# Patient Record
Sex: Female | Born: 1995 | Race: Black or African American | Hispanic: No | Marital: Single | Smoking: Never smoker
Health system: Southern US, Community
[De-identification: ages and names within clinical notes are randomized; demographics above are authoritative.]

## PROBLEM LIST (undated history)

## (undated) DIAGNOSIS — J45909 Unspecified asthma, uncomplicated: Secondary | ICD-10-CM

---

## 2014-11-06 ENCOUNTER — Encounter (HOSPITAL_COMMUNITY): Payer: Self-pay | Admitting: Emergency Medicine

## 2014-11-06 ENCOUNTER — Emergency Department (HOSPITAL_COMMUNITY)
Admission: EM | Admit: 2014-11-06 | Discharge: 2014-11-06 | Disposition: A | Discharge status: Home / Self Care | Payer: No Typology Code available for payment source | Attending: Emergency Medicine | Admitting: Emergency Medicine

## 2014-11-06 DIAGNOSIS — J45901 Unspecified asthma with (acute) exacerbation: Secondary | ICD-10-CM | POA: Insufficient documentation

## 2014-11-06 DIAGNOSIS — R062 Wheezing: Secondary | ICD-10-CM | POA: Diagnosis present

## 2014-11-06 MED ORDER — PREDNISONE 20 MG PO TABS
60.0000 mg | ORAL_TABLET | Freq: Every day | ORAL | Status: DC
Start: 2014-11-07 — End: 2014-11-11

## 2014-11-06 MED ORDER — IPRATROPIUM-ALBUTEROL 0.5-2.5 (3) MG/3ML IN SOLN
RESPIRATORY_TRACT | Status: AC
  Filled 2014-11-06: qty 3

## 2014-11-06 MED ORDER — METHYLPREDNISOLONE SODIUM SUCC 125 MG IJ SOLR
125.0000 mg | Freq: Once | INTRAMUSCULAR | Status: AC
  Administered 2014-11-06: 125 mg via INTRAVENOUS
  Filled 2014-11-06: qty 2

## 2014-11-06 MED ORDER — IPRATROPIUM-ALBUTEROL 0.5-2.5 (3) MG/3ML IN SOLN
3.0000 mL | Freq: Once | RESPIRATORY_TRACT | Status: AC
  Administered 2014-11-06: 3 mL via RESPIRATORY_TRACT

## 2014-11-06 MED ORDER — IPRATROPIUM-ALBUTEROL 0.5-2.5 (3) MG/3ML IN SOLN
3.0000 mL | RESPIRATORY_TRACT | Status: DC | PRN
  Administered 2014-11-06: 3 mL via RESPIRATORY_TRACT
  Filled 2014-11-06: qty 3

## 2014-11-06 NOTE — ED Provider Notes (Signed)
CSN: [id number]     Arrival date & time 11/06/14  1904 History   First MD Initiated Contact with Patient 11/06/14 2002     Chief Complaint  Patient presents with  . Wheezing  . URI   (Consider location/radiation/quality/duration/timing/severity/associated sxs/prior Treatment) Patient is a 19 y.o. female presenting with wheezing and URI.  Wheezing Severity:  Moderate Severity compared to prior episodes:  Similar Onset quality:  Gradual Timing:  Constant Progression:  Worsening Chronicity:  Recurrent Context comment:  URI Relieved by:  Nothing Ineffective treatments:  Beta-agonist inhaler Associated symptoms: chest tightness, cough, rhinorrhea and shortness of breath   Associated symptoms: no chest pain, no ear pain, no fatigue, no fever, no sore throat, no sputum production and no stridor   URI Presenting symptoms: congestion, cough and rhinorrhea   Presenting symptoms: no ear pain, no fatigue, no fever and no sore throat   Associated symptoms: wheezing     History reviewed. No pertinent past medical history. History reviewed. No pertinent past surgical history. No family history on file. Social History  Substance Use Topics  . Smoking status: Never Smoker   . Smokeless tobacco: None  . Alcohol Use: No   OB History    No data available     Review of Systems  Constitutional: Negative for fever and fatigue.  HENT: Positive for congestion and rhinorrhea. Negative for ear pain and sore throat.   Respiratory: Positive for cough, chest tightness, shortness of breath and wheezing. Negative for sputum production and stridor.   Cardiovascular: Negative for chest pain.  Gastrointestinal: Negative for vomiting, abdominal pain and constipation.  Genitourinary: Negative for dysuria, flank pain and difficulty urinating.  Skin: Negative for pallor.  All other systems reviewed and are negative.  Allergies  Review of patient's allergies indicates no known allergies.  Home  Medications   Prior to Admission medications   Not on File   BP 122/72 mmHg  Pulse 108  Temp(Src) 97.6 F (36.4 C) (Oral)  Resp 16  Ht  (1.651 m)  Wt 161 lb 3 oz (73.114 kg)  BMI 26.82 kg/m2  SpO2 100%  LMP 10/13/2014 (Exact Date) Physical Exam  Constitutional: She is oriented to person, place, and time. She appears well-developed and well-nourished. No distress.  HENT:  Head: Normocephalic.  Eyes: Pupils are equal, round, and reactive to light. No scleral icterus.  Neck: Normal range of motion. No JVD present.  Cardiovascular: Normal rate.   No murmur heard. Pulmonary/Chest: She has wheezes. She has no rales. She exhibits no tenderness.  Bilateral inspiratory wheezing  Abdominal: Soft. She exhibits no distension. There is no tenderness. There is no rebound and no guarding.  Musculoskeletal: Normal range of motion.  Neurological: She is alert and oriented to person, place, and time. No cranial nerve deficit. Coordination normal.  Skin: Skin is warm and dry. She is not diaphoretic.  Psychiatric: Her behavior is normal.  Nursing note and vitals reviewed.   ED Course  Procedures (including critical care time)   MDM   Presenting with asthma exacerbation that started after having congestion, cough, runny nose since last night. No fevers. Has had history of hospitalization and intubation and came in once her albuterol inhalers were only minimally improving her wheezing. Takes pulmicort and albuterol as directed.  No distress.   Duonebs X 2 and solumedrol. Patient has improved. Feels safe for discharge.  Discharged home in good health with prednisone for 5 days. Patient given strict return precautions. Patient in agreement and  discharged home in good health.   Final diagnoses:  Asthma exacerbation      Deirdre Peer, MD 11/07/14 0001  Mirian Mo, MD 11/12/14 [phone]

## 2014-11-06 NOTE — Discharge Instructions (Signed)

## 2014-11-06 NOTE — ED Notes (Addendum)
Patient here with complaint of asthma exacerbation secondary to upper respiratory illness since yesterday. Denies fever. Endorses congestion and cough. Has been using albuterol nebulizer q4 hrs at home with minimal improvement.

## 2014-11-09 ENCOUNTER — Emergency Department (HOSPITAL_COMMUNITY): Payer: No Typology Code available for payment source

## 2014-11-09 ENCOUNTER — Inpatient Hospital Stay (HOSPITAL_COMMUNITY)
Admission: EM | Admit: 2014-11-09 | Discharge: 2014-11-11 | DRG: 203 | Disposition: A | Discharge status: Home / Self Care | Payer: No Typology Code available for payment source | Attending: Internal Medicine | Admitting: Internal Medicine

## 2014-11-09 ENCOUNTER — Encounter (HOSPITAL_COMMUNITY): Payer: Self-pay | Admitting: *Deleted

## 2014-11-09 DIAGNOSIS — R000 Tachycardia, unspecified: Secondary | ICD-10-CM | POA: Diagnosis present

## 2014-11-09 DIAGNOSIS — J45901 Unspecified asthma with (acute) exacerbation: Secondary | ICD-10-CM

## 2014-11-09 DIAGNOSIS — J4541 Moderate persistent asthma with (acute) exacerbation: Secondary | ICD-10-CM | POA: Diagnosis not present

## 2014-11-09 HISTORY — DX: Unspecified asthma, uncomplicated: J45.909

## 2014-11-09 LAB — I-STAT CHEM 8, ED
BUN: 11 mg/dL (ref 6–20)
CREATININE: 0.8 mg/dL (ref 0.44–1.00)
Calcium, Ion: 1.18 mmol/L (ref 1.12–1.23)
Chloride: 104 mmol/L (ref 101–111)
GLUCOSE: 86 mg/dL (ref 65–99)
HEMATOCRIT: 42 % (ref 36.0–46.0)
Hemoglobin: 14.3 g/dL (ref 12.0–15.0)
Potassium: 4.1 mmol/L (ref 3.5–5.1)
SODIUM: 142 mmol/L (ref 135–145)
TCO2: 26 mmol/L (ref 0–100)

## 2014-11-09 LAB — CBC WITH DIFFERENTIAL/PLATELET
BASOS PCT: 0 %
Basophils Absolute: 0 10*3/uL (ref 0.0–0.1)
EOS ABS: 0 10*3/uL (ref 0.0–0.7)
EOS PCT: 0 %
HCT: 39.1 % (ref 36.0–46.0)
Hemoglobin: 13.2 g/dL (ref 12.0–15.0)
LYMPHS ABS: 1 10*3/uL (ref 0.7–4.0)
Lymphocytes Relative: 11 %
MCH: 31.7 pg (ref 26.0–34.0)
MCHC: 33.8 g/dL (ref 30.0–36.0)
MCV: 93.8 fL (ref 78.0–100.0)
MONO ABS: 0.8 10*3/uL (ref 0.1–1.0)
MONOS PCT: 9 %
NEUTROS PCT: 80 %
Neutro Abs: 7.1 10*3/uL (ref 1.7–7.7)
PLATELETS: 225 10*3/uL (ref 150–400)
RBC: 4.17 MIL/uL (ref 3.87–5.11)
RDW: 12.6 % (ref 11.5–15.5)
WBC: 8.8 10*3/uL (ref 4.0–10.5)

## 2014-11-09 LAB — D-DIMER, QUANTITATIVE (NOT AT ARMC)

## 2014-11-09 MED ORDER — SODIUM CHLORIDE 0.9 % IV SOLN
Freq: Once | INTRAVENOUS | Status: AC
  Administered 2014-11-09: 20:00:00 via INTRAVENOUS

## 2014-11-09 MED ORDER — IPRATROPIUM-ALBUTEROL 0.5-2.5 (3) MG/3ML IN SOLN
RESPIRATORY_TRACT | Status: AC
  Administered 2014-11-09: 3 mL via RESPIRATORY_TRACT
  Filled 2014-11-09: qty 3

## 2014-11-09 MED ORDER — IPRATROPIUM-ALBUTEROL 0.5-2.5 (3) MG/3ML IN SOLN
3.0000 mL | Freq: Once | RESPIRATORY_TRACT | Status: AC
  Administered 2014-11-09: 3 mL via RESPIRATORY_TRACT

## 2014-11-09 MED ORDER — METHYLPREDNISOLONE SODIUM SUCC 125 MG IJ SOLR
60.0000 mg | Freq: Once | INTRAMUSCULAR | Status: AC
  Administered 2014-11-09: 60 mg via INTRAVENOUS
  Filled 2014-11-09: qty 2

## 2014-11-09 MED ORDER — ALBUTEROL (5 MG/ML) CONTINUOUS INHALATION SOLN
10.0000 mg/h | INHALATION_SOLUTION | Freq: Once | RESPIRATORY_TRACT | Status: AC
  Administered 2014-11-09: 10 mg/h via RESPIRATORY_TRACT
  Filled 2014-11-09: qty 20

## 2014-11-09 MED ORDER — MAGNESIUM SULFATE 2 GM/50ML IV SOLN
2.0000 g | Freq: Once | INTRAVENOUS | Status: AC
  Administered 2014-11-09: 2 g via INTRAVENOUS
  Filled 2014-11-09: qty 50

## 2014-11-09 NOTE — ED Notes (Signed)
Pt states that she was seen here a few days ago for same states that she was started on nebs and prednisone with no relief. Pt has inspiratory and expiratory wheezing in triage.

## 2014-11-09 NOTE — ED Provider Notes (Signed)
CSN: [id number]     Arrival date & time 11/09/14  1810 History   First MD Initiated Contact with Patient 11/09/14 1959     Chief Complaint  Patient presents with  . Asthma     (Consider location/radiation/quality/duration/timing/severity/associated sxs/prior Treatment) HPI Comments: This is a 19 year old female whose had asthma "all her life" who does not have frequent exacerbations.  She uses albuterol inhaler successfully at home.  Most of the time, 3 days ago she started having URI symptoms with an exacerbation of her asthma and wheezing.  She was seen in this emergency department, started on 60 mg prednisone daily for 5 days with instructions to increase the use of her halo 2 every 4-6 hours, which she has been doing.  She states she might have about an hour relief from wheezing.  She does state she was able to sleep.  Lastly for the first time in 3 days, but any activity causes the wheezing to return along with shortness of breath and coughing. Denies any risk factors for PE.  No recent travel, no recent surgery.  No family history or personal history of DVTs, no hormone therapy.    Patient is a 19 y.o. female presenting with asthma. The history is provided by the patient.  Asthma This is a recurrent problem. The current episode started in the past 7 days. The problem has been unchanged. Associated symptoms include coughing. Pertinent negatives include no chills, congestion, fever, nausea, rash, sore throat or swollen glands. Nothing aggravates the symptoms. She has tried nothing for the symptoms. The treatment provided no relief.    Past Medical History  Diagnosis Date  . Asthma    History reviewed. No pertinent past surgical history. No family history on file. Social History  Substance Use Topics  . Smoking status: Never Smoker   . Smokeless tobacco: None  . Alcohol Use: No   OB History    No data available     Review of Systems  Constitutional: Negative for fever and  chills.  HENT: Negative for congestion and sore throat.   Respiratory: Positive for cough, shortness of breath and wheezing.   Gastrointestinal: Negative for nausea.  Skin: Negative for rash.  Neurological: Negative for dizziness.  All other systems reviewed and are negative.     Allergies  Review of patient's allergies indicates no known allergies.  Home Medications   Prior to Admission medications   Medication Sig Start Date End Date Taking? Authorizing Provider  albuterol (PROVENTIL HFA;VENTOLIN HFA) 108 (90 BASE) MCG/ACT inhaler Inhale 1-2 puffs into the lungs every 6 (six) hours as needed for wheezing or shortness of breath.   Yes Historical Provider, MD  albuterol (PROVENTIL) (2.5 MG/3ML) 0.083% nebulizer solution Take 2.5 mg by nebulization every 4 (four) hours as needed for wheezing or shortness of breath.   Yes Historical Provider, MD  Budesonide (PULMICORT FLEXHALER) 90 MCG/ACT inhaler Inhale 2 puffs into the lungs 2 (two) times daily.   Yes Historical Provider, MD  predniSONE (DELTASONE) 20 MG tablet Take 3 tablets (60 mg total) by mouth daily. 11/07/14 11/11/14 Yes Deirdre Peer, MD   BP 106/51 mmHg  Pulse 87  Temp(Src) 98.3 F (36.8 C) (Oral)  Resp 21  Ht  (1.651 m)  Wt 156 lb (70.761 kg)  BMI 25.96 kg/m2  SpO2 96%  LMP 10/13/2014 (Exact Date) Physical Exam  Constitutional: She appears well-developed and well-nourished.  HENT:  Head: Normocephalic.  Eyes: Pupils are equal, round, and reactive to light.  Neck: Normal range of motion.  Cardiovascular: Normal rate and regular rhythm.   Pulmonary/Chest: Effort normal. No respiratory distress. She has wheezes. She exhibits no tenderness.  Abdominal: Soft. She exhibits no distension.  Musculoskeletal: Normal range of motion.  Neurological: She is alert.  Skin: Skin is warm. No rash noted. No pallor.  Nursing note and vitals reviewed.   ED Course  Procedures (including critical care time) Labs Review Labs  Reviewed  CBC WITH DIFFERENTIAL/PLATELET  D-DIMER, QUANTITATIVE (NOT AT [facility])  I-STAT CHEM 8, ED    Imaging Review Dg Chest 2 View  11/09/2014   CLINICAL DATA:  Shortness of breath.  Wheezing.  History of asthma.  EXAM: CHEST  2 VIEW  COMPARISON:  None.  FINDINGS: Normal heart size. Normal mediastinal contour. No pneumothorax. No pleural effusion. Clear lungs, with no focal lung consolidation and no pulmonary edema. Lungs do not appear significantly hyperinflated. Visualized osseous structures appear intact.  IMPRESSION: No active disease in the chest.   Electronically Signed   By: Delbert Phenix M.D.   On: 11/09/2014 21:16   I have personally reviewed and evaluated these images and lab results as part of my medical decision-making.   EKG Interpretation None     after I will augment treatment.  Patient is still wheezing.  She will be given 60 mg of Solu-Medrol IV 2 g of magnesium IV and admitted to the hospital for regular Solu-Medrol and neb treatments until her exacerbation resolves Patient reexamined after Solu-Medrol and magnesium IV.  She still having expiratory  wheezing remains tachycardic at 127  MDM   Final diagnoses:  Asthma exacerbation         Earley Favor, NP 11/10/14 0107  Richardean Canal, MD 11/10/14 [phone]

## 2014-11-10 DIAGNOSIS — R000 Tachycardia, unspecified: Secondary | ICD-10-CM | POA: Diagnosis present

## 2014-11-10 DIAGNOSIS — J45901 Unspecified asthma with (acute) exacerbation: Secondary | ICD-10-CM | POA: Diagnosis present

## 2014-11-10 DIAGNOSIS — J4541 Moderate persistent asthma with (acute) exacerbation: Secondary | ICD-10-CM | POA: Diagnosis present

## 2014-11-10 LAB — PREGNANCY, URINE: Preg Test, Ur: NEGATIVE

## 2014-11-10 MED ORDER — ALBUTEROL SULFATE (2.5 MG/3ML) 0.083% IN NEBU
2.5000 mg | INHALATION_SOLUTION | RESPIRATORY_TRACT | Status: DC
  Administered 2014-11-10 – 2014-11-11 (×9): 2.5 mg via RESPIRATORY_TRACT
  Filled 2014-11-10 (×8): qty 3

## 2014-11-10 MED ORDER — ACETAMINOPHEN 325 MG PO TABS: 650.0000 mg | ORAL_TABLET | Freq: Four times a day (QID) | ORAL | Status: DC | PRN

## 2014-11-10 MED ORDER — METHYLPREDNISOLONE SODIUM SUCC 125 MG IJ SOLR: 60.0000 mg | Freq: Two times a day (BID) | INTRAMUSCULAR | Status: DC

## 2014-11-10 MED ORDER — ONDANSETRON HCL 4 MG PO TABS: 4.0000 mg | ORAL_TABLET | Freq: Four times a day (QID) | ORAL | Status: DC | PRN

## 2014-11-10 MED ORDER — ENOXAPARIN SODIUM 40 MG/0.4ML ~~LOC~~ SOLN: 40.0000 mg | SUBCUTANEOUS | Status: DC

## 2014-11-10 MED ORDER — GUAIFENESIN ER 600 MG PO TB12: 600.0000 mg | ORAL_TABLET | Freq: Two times a day (BID) | ORAL | Status: DC

## 2014-11-10 MED ORDER — ONDANSETRON HCL 4 MG/2ML IJ SOLN: 4.0000 mg | Freq: Four times a day (QID) | INTRAMUSCULAR | Status: DC | PRN

## 2014-11-10 MED ORDER — METHYLPREDNISOLONE SODIUM SUCC 125 MG IJ SOLR
60.0000 mg | Freq: Four times a day (QID) | INTRAMUSCULAR | Status: DC
  Administered 2014-11-10 – 2014-11-11 (×5): 60 mg via INTRAVENOUS
  Filled 2014-11-10 (×5): qty 2

## 2014-11-10 MED ORDER — ACETAMINOPHEN 650 MG RE SUPP: 650.0000 mg | Freq: Four times a day (QID) | RECTAL | Status: DC | PRN

## 2014-11-10 MED ORDER — GUAIFENESIN ER 600 MG PO TB12
1200.0000 mg | ORAL_TABLET | Freq: Two times a day (BID) | ORAL | Status: DC
  Administered 2014-11-10 (×2): 1200 mg via ORAL
  Filled 2014-11-10 (×2): qty 2

## 2014-11-10 MED ORDER — IPRATROPIUM BROMIDE 0.02 % IN SOLN: 0.5000 mg | RESPIRATORY_TRACT | Status: DC | PRN

## 2014-11-10 NOTE — Care Management Note (Signed)
Case Management Note  Patient Details  Name: Raven Holland MRN: [id number] Date of Birth: 1995-04-23  Subjective/Objective:                  Date:11-10-14 Thursday Spoke with patient at the bedside. Introduced self as Sports coach and explained role in discharge planning and how to be reached. Verified patient lives in [place], [address], [address] [place] [address], in apartment with roommate. Patient originally  From [facility] with parents, Webb Silversmith- mom [name]- dad [address] [place]. [phone]. Verified patient anticipates to go Apt. W/ roommates at time of discharge. Patient has DME nebulizer. Expressed potential need for no other DME. Patient denied needing help with their medication. Patient states she is on her parent's insurance [employer] member #[id number] Group# [id number]. Patient drives to MD appointments. Verified patient has PCP Dr Alycia Rossetti in [place], [place]. Patient states they currently receive HH services through no one.  Patient was provided choice and selected name for home health needs if they arise.    Lawerance Sabal RN BSN CM [phone]   Action/Plan:  Will continue to follow.   Expected Discharge Date:                  Expected Discharge Plan:  Home/Self Care  In-House Referral:     Discharge planning Services  CM Consult  Post Acute Care Choice:    Choice offered to:     DME Arranged:    DME Agency:     HH Arranged:    HH Agency:     Status of Service:  In process, will continue to follow  Medicare Important Message Given:    Date Medicare IM Given:    Medicare IM give by:    Date Additional Medicare IM Given:    Additional Medicare Important Message give by:     If discussed at Long Length of Stay Meetings, dates discussed:    Additional Comments:  Lawerance Sabal, RN 11/10/2014, 11:17 AM

## 2014-11-10 NOTE — H&P (Signed)
[facility] History and Physical  Patient: Raven Holland  MRN: [id number]  DOB: 1995-12-17  DOS: the patient was seen and examined on 11/10/2014 PCP: No PCP Per Patient  Referring physician: Dr. Silverio Lay Chief Complaint: Cough and shortness of breath  HPI: Raven Holland is a 19 y.o. female with Past medical history of asthma. The patient presents with numbness of progressively worsening cough and shortness of breath. The patient was initially seen in the ER for this any worsening of her shortness of breath on 11/06/2014. The patient was discharged on oral prednisone as well as when necessary nebulizers. The patient was able to do nebulizers at least 3-4 times a day. Despite this the patient continues to have progressively worsening cough and shortness of breath and therefore decided to come to the hospital she complains of some chest tightness. Denies having any fever or chills no nausea and vomiting. No diarrhea no constipation no burning urination or leg swelling. No recent travel no recent exposure.  The patient is coming from home.  At her baseline ambulates without support And is independent for most of her ADL; manages her medication on her own.  Review of Systems: as mentioned in the history of present illness.  A comprehensive review of the other systems is negative.  Past Medical History  Diagnosis Date  . Asthma    History reviewed. No pertinent past surgical history. Social History:  reports that she has never smoked. She does not have any smokeless tobacco history on file. She reports that she does not drink alcohol or use illicit drugs.  No Known Allergies  No family history on file.  Prior to Admission medications   Medication Sig Start Date End Date Taking? Authorizing Provider  albuterol (PROVENTIL HFA;VENTOLIN HFA) 108 (90 BASE) MCG/ACT inhaler Inhale 1-2 puffs into the lungs every 6 (six) hours as needed for wheezing or shortness of breath.   Yes Historical  Provider, MD  albuterol (PROVENTIL) (2.5 MG/3ML) 0.083% nebulizer solution Take 2.5 mg by nebulization every 4 (four) hours as needed for wheezing or shortness of breath.   Yes Historical Provider, MD  Budesonide (PULMICORT FLEXHALER) 90 MCG/ACT inhaler Inhale 2 puffs into the lungs 2 (two) times daily.   Yes Historical Provider, MD  predniSONE (DELTASONE) 20 MG tablet Take 3 tablets (60 mg total) by mouth daily. 11/07/14 11/11/14 Yes Deirdre Peer, MD    Physical Exam: Filed Vitals:   11/10/14 0030 11/10/14 0143 11/10/14 0213 11/10/14 0322  BP: 106/51 117/71    Pulse: 87 87    Temp:  98 F (36.7 C)    TempSrc:  Oral    Resp:  18    Height:      Weight:      SpO2: 96% 97% 98% 98%    General: Alert, Awake and Oriented to Time, Place and Person. Appear in mild distress Eyes: PERRL ENT: Oral Mucosa clear moist. Neck: no JVD Cardiovascular: S1 and S2 Present, no Murmur, Peripheral Pulses Present Respiratory: Bilateral Air entry equal and Decreased,  no Crackles, extensive expiratory wheezes Abdomen: Bowel Sound present, Soft and no tenderness Skin: no Rash Extremities: no Pedal edema, no calf tenderness Neurologic: Grossly no focal neuro deficit.  Labs on Admission:  CBC:  Recent Labs Lab 11/09/14 2020 11/09/14 2026  WBC 8.8  --   NEUTROABS 7.1  --   HGB 13.2 14.3  HCT 39.1 42.0  MCV 93.8  --   PLT 225  --     CMP  Component Value Date/Time   NA 142 11/09/2014 2026   K 4.1 11/09/2014 2026   CL 104 11/09/2014 2026   GLUCOSE 86 11/09/2014 2026   BUN 11 11/09/2014 2026   CREATININE 0.80 11/09/2014 2026    No results for input(s): CKTOTAL, CKMB, CKMBINDEX, TROPONINI in the last 168 hours. BNP (last 3 results) No results for input(s): BNP in the last 8760 hours.  ProBNP (last 3 results) No results for input(s): PROBNP in the last 8760 hours.   Radiological Exams on Admission: Dg Chest 2 View  11/09/2014   CLINICAL DATA:  Shortness of breath.  Wheezing.   History of asthma.  EXAM: CHEST  2 VIEW  COMPARISON:  None.  FINDINGS: Normal heart size. Normal mediastinal contour. No pneumothorax. No pleural effusion. Clear lungs, with no focal lung consolidation and no pulmonary edema. Lungs do not appear significantly hyperinflated. Visualized osseous structures appear intact.  IMPRESSION: No active disease in the chest.   Electronically Signed   By: Delbert Phenix M.D.   On: 11/09/2014 21:16   Assessment/Plan 1. Asthma exacerbation The patient is presenting with numbness of progressively worsening cough and shortness of breath. Next and the patient has received albuterol ipratropium as well as Solu-Medrol in the ER as well as magnesium. Despite this treatment the patient has not had any improvement in her breathing as well as improvement in the wheezing. Patient remains tachycardic as well as tachypneic. With this the patient will be admitted in the hospital for observation. Next I would continue albuterol every 4 hours as well as a continue Solu-Medrol and Mucinex. She will be also given when necessary ipratropium.  Nutrition: Regular diet DVT Prophylaxis: subcutaneous Heparin  Advance goals of care discussion:  Full code  Disposition: Admitted as observation, med-surge unit.  Author: Lynden Oxford, MD [facility] Pager: [phone] 11/10/2014  If 7PM-7AM, please contact night-coverage [email or web address] Password [email or web address]

## 2014-11-10 NOTE — Progress Notes (Signed)
[facility] PROGRESS NOTE  Raven Holland ZOX:[id number] DOB: [birth year] DOA: 11/09/2014 PCP: No PCP Per Patient  HPI: Raven Holland is a 19 y.o female who was admitted to the hospital on 11/09/14 following an ED visit for persistent cough, SOB and wheeze. She was seen for the same complaints in the ED on 11/06/14 and was discharged with oral prednisone and at-home inhalers. Symptoms did not improve despite compliance with medication regimen. No infectious etiology identified in ED. PMH includes asthma triggered by fall weather changes, which required previous hospitalization in October on 2015 and intubation.   Subjective: Today the patient reports that she is doing much better. She was able to sleep through the night and has noticed significant decrease in wheezing. Cough still present, but now nonproductive. She said she is not back to baseline yet, but very close. Denies fever, chills, chest pain, palpitations, or SOB. No N/V/D or abdominal pain.   Assessment/Plan: Principle problem: Acute Asthma Exacerbation:  - Evident by progressively worsening cough, SOB, and wheezing with associated tachycardia/tachypnea despite oral Prednisone, Pulmicort, and Albuterol nebulizer 4x/day for 2 days.  - No infectious etiology suspected- patient is afebrile without leukocytosis, CXR reveled no acute disease.  - Tachycardia and tachypnea resolved, mild b/l wheeze remains.  - Continue mucinex for cough/congestion and nebulized duoneb q4h PRN, Solu-medrol q6h, and albuterol q4h PRN today. Monitor for resolution of wheeze and/or worsening of respiratory symptoms.    Active Problem:  Moderate persistent asthma, uncontrolled:  - History of asthma since the age of 75 months. Managed by pediatrician, no pulmonologist - Reports use of PRN albuterol nebulizer at least 3x/week in addition to daily use of inhaled albuterol PRN and pulmicort BID - Will  control acute asthma exacerbation during hospitalization,  recommend outpatient FU with a pulmonologist for asthma control  Code Status: Full Family Communication: Cousin at bedside Disposition Plan: Home when stable  DVT Prophylaxis: Lovenox    Consultants:  None  Procedures:  None  Antibiotics:  None    Objective: Filed Vitals:   11/10/14 0703  BP: 103/52  Pulse: 76  Temp:   Resp: 13    Intake/Output Summary (Last 24 hours) at 11/10/14 1011 Last data filed at 11/10/14 8119  Gross per 24 hour  Intake      0 ml  Output    600 ml  Net   -600 ml   Filed Weights   11/09/14 1814  Weight: 70.761 kg (156 lb)    Exam:   General:  Well-appearing young woman, sitting up in bed with no acute distress  Cardiovascular: RRR, no m/r/g. No peripheral edema   Respiratory: Distant breath sounds bilaterally with mild expiratory wheeze. No crackles   Abdomen: Soft, non-tender, non-distended. + BS   Neuro: A&Ox3, No focal neurological deficits  Data Reviewed: Basic Metabolic Panel:  Recent Labs Lab 11/09/14 2026  NA 142  K 4.1  CL 104  GLUCOSE 86  BUN 11  CREATININE 0.80   Liver Function Tests: No results for input(s): AST, ALT, ALKPHOS, BILITOT, PROT, ALBUMIN in the last 168 hours. No results for input(s): LIPASE, AMYLASE in the last 168 hours. No results for input(s): AMMONIA in the last 168 hours. CBC:  Recent Labs Lab 11/09/14 2020 11/09/14 2026  WBC 8.8  --   NEUTROABS 7.1  --   HGB 13.2 14.3  HCT 39.1 42.0  MCV 93.8  --   PLT 225  --    Cardiac Enzymes: No results for input(s): CKTOTAL, CKMB,  CKMBINDEX, TROPONINI in the last 168 hours. BNP (last 3 results) No results for input(s): BNP in the last 8760 hours.  ProBNP (last 3 results) No results for input(s): PROBNP in the last 8760 hours.  CBG: No results for input(s): GLUCAP in the last 168 hours.  No results found for this or any previous visit (from the past 240 hour(s)).   Studies: Dg Chest 2 View  11/09/2014   CLINICAL DATA:   Shortness of breath.  Wheezing.  History of asthma.  EXAM: CHEST  2 VIEW  COMPARISON:  None.  FINDINGS: Normal heart size. Normal mediastinal contour. No pneumothorax. No pleural effusion. Clear lungs, with no focal lung consolidation and no pulmonary edema. Lungs do not appear significantly hyperinflated. Visualized osseous structures appear intact.  IMPRESSION: No active disease in the chest.   Electronically Signed   By: Delbert Phenix M.D.   On: 11/09/2014 21:16    Scheduled Meds: . albuterol  2.5 mg Nebulization Q4H  . enoxaparin (LOVENOX) injection  40 mg Subcutaneous Q24H  . guaiFENesin  1,200 mg Oral BID  . methylPREDNISolone (SOLU-MEDROL) injection  60 mg Intravenous Q6H   Continuous Infusions:   Principal Problem:   Asthma exacerbation    Time spent: 20 minutes    Micayla Zeltman, Student-PA  [facility] If 7PM-7AM, please contact night-coverage at [email or web address], password [email or web address] 11/10/2014, 10:11 AM      Addendum  Patient seen and examined, chart and data base reviewed.  I agree with the above assessment and plan.  For full details please see Mrs. Micayla Zeltman, Student-PA note.  I reviewed and amended the above note as appropriate.   Clint Lipps, MD [facility] Pager: [phone] 11/10/2014, 12:21 PM

## 2014-11-11 DIAGNOSIS — R000 Tachycardia, unspecified: Secondary | ICD-10-CM

## 2014-11-11 MED ORDER — PREDNISONE 10 MG PO TABS: ORAL_TABLET | ORAL | Status: DC

## 2014-11-11 MED ORDER — GUAIFENESIN ER 600 MG PO TB12: 1200.0000 mg | ORAL_TABLET | Freq: Two times a day (BID) | ORAL | Status: DC

## 2014-11-11 NOTE — Progress Notes (Signed)
Patient discharge teaching given, including activity, diet, follow-up appoints, and medications. Patient verbalized understanding of all discharge instructions. IV access was d/c'd. Vitals are stable. Skin is intact except as charted in most recent assessments. Pt to be escorted out by NT, to be driven home by family. 

## 2014-11-11 NOTE — Discharge Summary (Signed)
Physician Discharge Summary  Shakeera Rightmyer ZOX:[id number] DOB: [birth year] DOA: 11/09/2014  PCP: No PCP Per Patient  Admit date: 11/09/2014 Discharge date: 11/11/2014  Time spent: 15 minutes  Recommendations for Outpatient Follow-up:  1. FU w/ PCP regarding re-fills of chronic albuterol medications, Recommend finding adult-care PCP  2. Contact a Pulmonologist for appropriate management of uncontrolled asthma   Discharge Diagnoses:  Principal Problem:   Asthma exacerbation Active Problems:   Tachycardia  Principle problem: Acute Asthma Exacerbation:  - Evident by progressively worsening cough, SOB, and wheezing with associated tachycardia/tachypnea despite oral Prednisone, Pulmicort, and Albuterol nebulizer 4x/day for 2 days.  - No infectious etiology suspected- patient afebrile without leukocytosis, CXR reveled no acute disease.  - Tachycardia and tachypnea resolved, mild b/l wheeze remains d/t chronic asthma  - Discharged with mucinex for congestion and oral prednisone taper. Continue Pulmicort BID/albuterol PRN w/ Nebulizer PRN    Active Problem:  Moderate persistent asthma, uncontrolled:  - History of asthma since the age of 34 months. Managed by pediatrician, no pulmonologist - Reports use of PRN albuterol nebulizer at least 3x/week in addition to daily use of inhaled albuterol PRN and pulmicort BID - Acute asthma exacerbation controlled, recommend outpatient FU with Pulmonologist for asthma control  Discharge Condition: Stable for discharge home   Diet recommendation: None   Filed Weights   11/09/14 1814 11/11/14 0547  Weight: 70.761 kg (156 lb) 71.079 kg (156 lb 11.2 oz)    History of present illness:  Raven Holland is a 19 y.o female who was admitted to the hospital on 11/09/14 following an ED visit for persistent cough, SOB and wheeze. She was seen for the same complaints in the ED on 11/06/14 and was discharged with oral prednisone and at-home inhalers. Symptoms did not  improve despite compliance with medication regimen and patient was now tachycardia with tachypnea. In the ED, no infectious etiology identified and patient was started on Duoneb treatments, Magnesium, and Solu-medrol. PMH includes asthma triggered by fall weather changes, which required previous hospitalization in October on 2015 and intubation.   Hospital Course:  The patient was admitted to the hospital on 11/10/2014 for an acute asthma exacerbation. During hospitalization, patient was continued on Duoneb treatments q4h, Solu-medrol q6h, and albuterol q4h PRN. In addition, the patient was given Mucinex for complaints of upper respiratory congestion. With this medication regimen, the cough, and SOB resolved. Residual wheezing present, but much improved. Patient discharged on at-home inhalers and Prednisone taper.   Procedures:  None  Consultations:  None  Discharge Exam: Filed Vitals:   11/11/14 0546  BP: 115/68  Pulse: 92  Temp: 98.2 F (36.8 C)  Resp: 16    General: Well-appearing young woman, sitting up in bed with no acute distress  Cardiovascular: RRR, no m/r/g. No peripheral edema   Respiratory: Distant breath sounds bilaterally with inspiratory/expiratory wheeze. No crackles   Abdomen: Soft, non-tender, non-distended. + BS   Neuro: A&Ox3, No focal neurological deficits  Discharge Instructions   Discharge Instructions    Increase activity slowly    Complete by:  As directed           Current Discharge Medication List    START taking these medications   Details  guaiFENesin (MUCINEX) 600 MG 12 hr tablet Take 2 tablets (1,200 mg total) by mouth 2 (two) times daily. Qty: 30 tablet, Refills: 30      CONTINUE these medications which have CHANGED   Details  predniSONE (DELTASONE) 10 MG tablet Take 6-5-4-3-2-1 tablet  PO daily till it is gone Qty: 21 tablet, Refills: 0      CONTINUE these medications which have NOT CHANGED   Details  albuterol (PROVENTIL  HFA;VENTOLIN HFA) 108 (90 BASE) MCG/ACT inhaler Inhale 1-2 puffs into the lungs every 6 (six) hours as needed for wheezing or shortness of breath.    albuterol (PROVENTIL) (2.5 MG/3ML) 0.083% nebulizer solution Take 2.5 mg by nebulization every 4 (four) hours as needed for wheezing or shortness of breath.    Budesonide (PULMICORT FLEXHALER) 90 MCG/ACT inhaler Inhale 2 puffs into the lungs 2 (two) times daily.       No Known Allergies Follow-up Information    Follow up with RYAN, JEFFREY C, MD In 1 week.   Specialty:  Pediatrics   Contact information:   [address] [place] [address] [phone]        The results of significant diagnostics from this hospitalization (including imaging, microbiology, ancillary and laboratory) are listed below for reference.    Significant Diagnostic Studies: Dg Chest 2 View  11/09/2014   CLINICAL DATA:  Shortness of breath.  Wheezing.  History of asthma.  EXAM: CHEST  2 VIEW  COMPARISON:  None.  FINDINGS: Normal heart size. Normal mediastinal contour. No pneumothorax. No pleural effusion. Clear lungs, with no focal lung consolidation and no pulmonary edema. Lungs do not appear significantly hyperinflated. Visualized osseous structures appear intact.  IMPRESSION: No active disease in the chest.   Electronically Signed   By: Delbert Phenix M.D.   On: 11/09/2014 21:16    Microbiology: No results found for this or any previous visit (from the past 240 hour(s)).   Labs: Basic Metabolic Panel:  Recent Labs Lab 11/09/14 2026  NA 142  K 4.1  CL 104  GLUCOSE 86  BUN 11  CREATININE 0.80   Liver Function Tests: No results for input(s): AST, ALT, ALKPHOS, BILITOT, PROT, ALBUMIN in the last 168 hours. No results for input(s): LIPASE, AMYLASE in the last 168 hours. No results for input(s): AMMONIA in the last 168 hours. CBC:  Recent Labs Lab 11/09/14 2020 11/09/14 2026  WBC 8.8  --   NEUTROABS 7.1  --   HGB 13.2 14.3  HCT 39.1  42.0  MCV 93.8  --   PLT 225  --    Cardiac Enzymes: No results for input(s): CKTOTAL, CKMB, CKMBINDEX, TROPONINI in the last 168 hours. BNP: BNP (last 3 results) No results for input(s): BNP in the last 8760 hours.  ProBNP (last 3 results) No results for input(s): PROBNP in the last 8760 hours.  CBG: No results for input(s): GLUCAP in the last 168 hours.   Signed:  Connye Burkitt, Student-PA  [facility] 11/11/2014, 10:42 AM    Addendum  Patient seen and examined, chart and data base reviewed.  I agree with the above assessment and plan.  For full details please see Mrs. Micayla Zeltman, Student-PA note.  I reviewed and amended the above note as appropriate.   Clint Lipps, MD [facility] Pager: [phone] 11/11/2014, 12:14 PM

## 2015-03-30 ENCOUNTER — Emergency Department (INDEPENDENT_AMBULATORY_CARE_PROVIDER_SITE_OTHER)
Admission: EM | Admit: 2015-03-30 | Discharge: 2015-03-30 | Disposition: A | Discharge status: Home / Self Care | Payer: No Typology Code available for payment source | Source: Home / Self Care | Attending: Family Medicine | Admitting: Family Medicine

## 2015-03-30 ENCOUNTER — Encounter (HOSPITAL_COMMUNITY): Payer: Self-pay

## 2015-03-30 DIAGNOSIS — J45901 Unspecified asthma with (acute) exacerbation: Secondary | ICD-10-CM

## 2015-03-30 MED ORDER — PREDNISONE 10 MG PO TABS: ORAL_TABLET | ORAL | Status: DC

## 2015-03-30 MED ORDER — ALBUTEROL SULFATE (2.5 MG/3ML) 0.083% IN NEBU: 2.5000 mg | INHALATION_SOLUTION | Freq: Once | RESPIRATORY_TRACT | Status: DC

## 2015-03-30 MED ORDER — IPRATROPIUM BROMIDE 0.02 % IN SOLN
RESPIRATORY_TRACT | Status: AC
  Filled 2015-03-30: qty 2.5

## 2015-03-30 NOTE — ED Notes (Signed)
Pt stated that her inhalers have not been helping her asthma symptoms for the last 2 days. Pt stated that she is not currently in any pain. Pt alert and oriented

## 2015-03-30 NOTE — Discharge Instructions (Signed)
Asthma, Adult °Asthma is a condition of the lungs in which the airways tighten and narrow. Asthma can make it hard to breathe. Asthma cannot be cured, but medicine and lifestyle changes can help control it. Asthma may be started (triggered) by: °· Animal skin flakes (dander). °· Dust. °· Cockroaches. °· Pollen. °· Mold. °· Smoke. °· Cleaning products. °· Hair sprays or aerosol sprays. °· Paint fumes or strong smells. °· Cold air, weather changes, and winds. °· Crying or laughing hard. °· Stress. °· Certain medicines or drugs. °· Foods, such as dried fruit, potato chips, and sparkling grape juice. °· Infections or conditions (colds, flu). °· Exercise. °· Certain medical conditions or diseases. °· Exercise or tiring activities. °HOME CARE  °· Take medicine as told by your doctor. °· Use a peak flow meter as told by your doctor. A peak flow meter is a tool that measures how well the lungs are working. °· Record and keep track of the peak flow meter's readings. °· Understand and use the asthma action plan. An asthma action plan is a written plan for taking care of your asthma and treating your attacks. °· To help prevent asthma attacks: °· Do not smoke. Stay away from secondhand smoke. °· Change your heating and air conditioning filter often. °· Limit your use of fireplaces and wood stoves. °· Get rid of pests (such as roaches and mice) and their droppings. °· Throw away plants if you see mold on them. °· Clean your floors. Dust regularly. Use cleaning products that do not smell. °· Have someone vacuum when you are not home. Use a vacuum cleaner with a HEPA filter if possible. °· Replace carpet with wood, tile, or vinyl flooring. Carpet can trap animal skin flakes and dust. °· Use allergy-proof pillows, mattress covers, and box spring covers. °· Wash bed sheets and blankets every week in hot water and dry them in a dryer. °· Use blankets that are made of polyester or cotton. °· Clean bathrooms and kitchens with bleach.  If possible, have someone repaint the walls in these rooms with mold-resistant paint. Keep out of the rooms that are being cleaned and painted. °· Wash hands often. °GET HELP IF: °· You have make a whistling sound when breaking (wheeze), have shortness of breath, or have a cough even if taking medicine to prevent attacks. °· The colored mucus you cough up (sputum) is thicker than usual. °· The colored mucus you cough up changes from clear or white to yellow, green, gray, or bloody. °· You have problems from the medicine you are taking such as: °· A rash. °· Itching. °· Swelling. °· Trouble breathing. °· You need reliever medicines more than 2-3 times a week. °· Your peak flow measurement is still at 50-79% of your personal best after following the action plan for 1 hour. °· You have a fever. °GET HELP RIGHT AWAY IF:  °· You seem to be worse and are not responding to medicine during an asthma attack. °· You are short of breath even at rest. °· You get short of breath when doing very little activity. °· You have trouble eating, drinking, or talking. °· You have chest pain. °· You have a fast heartbeat. °· Your lips or fingernails start to turn blue. °· You are light-headed, dizzy, or faint. °· Your peak flow is less than 50% of your personal best. °  °This information is not intended to replace advice given to you by your health care provider. Make sure   you discuss any questions you have with your health care provider.   Document Released: 07/17/2007 Document Revised: 10/19/2014 Document Reviewed: 08/27/2012 Elsevier Interactive Patient Education 2016 [employer]  Asthma, Acute Bronchospasm Acute bronchospasm caused by asthma is also referred to as an asthma attack. Bronchospasm means your air passages become narrowed. The narrowing is caused by inflammation and tightening of the muscles in the air tubes (bronchi) in your lungs. This can make it hard to breathe or cause you to wheeze and  cough. CAUSES Possible triggers are:  Animal dander from the skin, hair, or feathers of animals.  Dust mites contained in house dust.  Cockroaches.  Pollen from trees or grass.  Mold.  Cigarette or tobacco smoke.  Air pollutants such as dust, household cleaners, hair sprays, aerosol sprays, paint fumes, strong chemicals, or strong odors.  Cold air or weather changes. Cold air may trigger inflammation. Winds increase molds and pollens in the air.  Strong emotions such as crying or laughing hard.  Stress.  Certain medicines such as aspirin or beta-blockers.  Sulfites in foods and drinks, such as dried fruits and wine.  Infections or inflammatory conditions, such as a flu, cold, or inflammation of the nasal membranes (rhinitis).  Gastroesophageal reflux disease (GERD). GERD is a condition where stomach acid backs up into your esophagus.  Exercise or strenuous activity. SIGNS AND SYMPTOMS   Wheezing.  Excessive coughing, particularly at night.  Chest tightness.  Shortness of breath. DIAGNOSIS  Your health care provider will ask you about your medical history and perform a physical exam. A chest X-ray or blood testing may be performed to look for other causes of your symptoms or other conditions that may have triggered your asthma attack. TREATMENT  Treatment is aimed at reducing inflammation and opening up the airways in your lungs. Most asthma attacks are treated with inhaled medicines. These include quick relief or rescue medicines (such as bronchodilators) and controller medicines (such as inhaled corticosteroids). These medicines are sometimes given through an inhaler or a nebulizer. Systemic steroid medicine taken by mouth or given through an IV tube also can be used to reduce the inflammation when an attack is moderate or severe. Antibiotic medicines are only used if a bacterial infection is present.  HOME CARE INSTRUCTIONS   Rest.  Drink plenty of liquids. This  helps the mucus to remain thin and be easily coughed up. Only use caffeine in moderation and do not use alcohol until you have recovered from your illness.  Do not smoke. Avoid being exposed to secondhand smoke.  You play a critical role in keeping yourself in good health. Avoid exposure to things that cause you to wheeze or to have breathing problems.  Keep your medicines up-to-date and available. Carefully follow your health care provider's treatment plan.  Take your medicine exactly as prescribed.  When pollen or pollution is bad, keep windows closed and use an air conditioner or go to places with air conditioning.  Asthma requires careful medical care. See your health care provider for a follow-up as advised. If you are more than [redacted] weeks pregnant and you were prescribed any new medicines, let your obstetrician know about the visit and how you are doing. Follow up with your health care provider as directed.  After you have recovered from your asthma attack, make an appointment with your outpatient doctor to talk about ways to reduce the likelihood of future attacks. If you do not have a doctor who manages your asthma, make an  appointment with a primary care doctor to discuss your asthma. SEEK IMMEDIATE MEDICAL CARE IF:   You are getting worse.  You have trouble breathing. If severe, call your local emergency services (911 in the [place]  You develop chest pain or discomfort.  You are vomiting.  You are not able to keep fluids down.  You are coughing up yellow, green, brown, or bloody sputum.  You have a fever and your symptoms suddenly get worse.  You have trouble swallowing. MAKE SURE YOU:   Understand these instructions.  Will watch your condition.  Will get help right away if you are not doing well or get worse.   This information is not intended to replace advice given to you by your health care provider. Make sure you discuss any questions you have with your health  care provider.   Document Released: 05/15/2006 Document Revised: 02/02/2013 Document Reviewed: 08/05/2012 Elsevier Interactive Patient Education [employer]

## 2015-03-31 NOTE — ED Provider Notes (Signed)
CSN: [id number]     Arrival date & time 03/30/15  1811 History   First MD Initiated Contact with Patient 03/30/15 1921     Chief Complaint  Patient presents with  . Asthma   (Consider location/radiation/quality/duration/timing/severity/associated sxs/prior Treatment) HPI Wheezing for the last few days. Use of inhaler and neb machine not really helping. Has increased use of treatments. Thinks it is because of the change in the weather No pain, fever, d&v Past Medical History  Diagnosis Date  . Asthma    History reviewed. No pertinent past surgical history. History reviewed. No pertinent family history. Social History  Substance Use Topics  . Smoking status: Never Smoker   . Smokeless tobacco: None  . Alcohol Use: No   OB History    No data available     Review of Systems See hpi Allergies  Review of patient's allergies indicates no known allergies.  Home Medications   Prior to Admission medications   Medication Sig Start Date End Date Taking? Authorizing Provider  albuterol (PROVENTIL HFA;VENTOLIN HFA) 108 (90 BASE) MCG/ACT inhaler Inhale 1-2 puffs into the lungs every 6 (six) hours as needed for wheezing or shortness of breath.   Yes Historical Provider, MD  albuterol (PROVENTIL) (2.5 MG/3ML) 0.083% nebulizer solution Take 2.5 mg by nebulization every 4 (four) hours as needed for wheezing or shortness of breath.   Yes Historical Provider, MD  Budesonide (PULMICORT FLEXHALER) 90 MCG/ACT inhaler Inhale 2 puffs into the lungs 2 (two) times daily.   Yes Historical Provider, MD  guaiFENesin (MUCINEX) 600 MG 12 hr tablet Take 2 tablets (1,200 mg total) by mouth 2 (two) times daily. 11/11/14  Yes Clydia Llano, MD  predniSONE (DELTASONE) 10 MG tablet Sig: 4 tables once a day for 3 days, 3 tablets once a day X3 days, 2 tablets a day for 3 days, 1 tablet a day for 3 days. 03/30/15   Tharon Aquas, PA   Meds Ordered and Administered this Visit  Medications - No data to display  BP  128/76 mmHg  Pulse 85  Temp(Src) 98 F (36.7 C) (Oral)  SpO2 97%  LMP 03/18/2015 No data found.   Physical Exam  Constitutional: She is oriented to person, place, and time. She appears well-developed and well-nourished. No distress.  Eyes: Conjunctivae are normal.  Pulmonary/Chest: Effort normal. She has wheezes.  Abdominal: Soft.  Musculoskeletal: Normal range of motion.  Neurological: She is alert and oriented to person, place, and time.  Skin: Skin is warm and dry.  Psychiatric: She has a normal mood and affect. Her behavior is normal.  Nursing note and vitals reviewed.   ED Course  Procedures (including critical care time)  Labs Review Labs Reviewed - No data to display  Imaging Review No results found.   Visual Acuity Review  Right Eye Distance:   Left Eye Distance:   Bilateral Distance:    Right Eye Near:   Left Eye Near:    Bilateral Near:       Pt states she feels better after treatment:  MDM   1. Asthma exacerbation    Patient is advised to continue home symptomatic treatment. Prescription forPrednisone  sent pharmacy patient has indicated. Patient is advised that if there are new or worsening symptoms or attend the emergency department, or contact primary care provider. Instructions of care provided discharged home in stable condition. Return to work/school note provided.  THIS NOTE WAS GENERATED USING A VOICE RECOGNITION SOFTWARE PROGRAM. ALL REASONABLE EFFORTS  WERE MADE TO PROOFREAD THIS DOCUMENT FOR ACCURACY.    Tharon Aquas, [place] 03/31/15 [phone]

## 2015-04-21 ENCOUNTER — Encounter (HOSPITAL_COMMUNITY): Payer: Self-pay | Admitting: Emergency Medicine

## 2015-04-21 ENCOUNTER — Emergency Department (HOSPITAL_COMMUNITY)
Admission: EM | Admit: 2015-04-21 | Discharge: 2015-04-21 | Disposition: A | Discharge status: Home / Self Care | Payer: No Typology Code available for payment source | Attending: Emergency Medicine | Admitting: Emergency Medicine

## 2015-04-21 DIAGNOSIS — J45901 Unspecified asthma with (acute) exacerbation: Secondary | ICD-10-CM | POA: Diagnosis not present

## 2015-04-21 DIAGNOSIS — Z7952 Long term (current) use of systemic steroids: Secondary | ICD-10-CM | POA: Insufficient documentation

## 2015-04-21 DIAGNOSIS — Z79899 Other long term (current) drug therapy: Secondary | ICD-10-CM | POA: Insufficient documentation

## 2015-04-21 DIAGNOSIS — Z7951 Long term (current) use of inhaled steroids: Secondary | ICD-10-CM | POA: Diagnosis not present

## 2015-04-21 DIAGNOSIS — R0602 Shortness of breath: Secondary | ICD-10-CM | POA: Diagnosis present

## 2015-04-21 MED ORDER — ALBUTEROL SULFATE (2.5 MG/3ML) 0.083% IN NEBU
INHALATION_SOLUTION | RESPIRATORY_TRACT | Status: AC
  Filled 2015-04-21: qty 6

## 2015-04-21 MED ORDER — IPRATROPIUM BROMIDE 0.02 % IN SOLN
0.5000 mg | Freq: Once | RESPIRATORY_TRACT | Status: AC
  Administered 2015-04-21: 0.5 mg via RESPIRATORY_TRACT
  Filled 2015-04-21: qty 2.5

## 2015-04-21 MED ORDER — ALBUTEROL (5 MG/ML) CONTINUOUS INHALATION SOLN
10.0000 mg/h | INHALATION_SOLUTION | Freq: Once | RESPIRATORY_TRACT | Status: AC
  Administered 2015-04-21: 10 mg/h via RESPIRATORY_TRACT
  Filled 2015-04-21: qty 20

## 2015-04-21 MED ORDER — DEXAMETHASONE SODIUM PHOSPHATE 10 MG/ML IJ SOLN
10.0000 mg | Freq: Once | INTRAMUSCULAR | Status: DC
  Filled 2015-04-21: qty 1

## 2015-04-21 MED ORDER — ALBUTEROL SULFATE HFA 108 (90 BASE) MCG/ACT IN AERS
2.0000 | INHALATION_SPRAY | Freq: Once | RESPIRATORY_TRACT | Status: AC
  Administered 2015-04-21: 2 via RESPIRATORY_TRACT
  Filled 2015-04-21: qty 6.7

## 2015-04-21 MED ORDER — IPRATROPIUM-ALBUTEROL 0.5-2.5 (3) MG/3ML IN SOLN
3.0000 mL | Freq: Once | RESPIRATORY_TRACT | Status: AC
  Administered 2015-04-21: 3 mL via RESPIRATORY_TRACT
  Filled 2015-04-21: qty 3

## 2015-04-21 MED ORDER — PREDNISONE 20 MG PO TABS
60.0000 mg | ORAL_TABLET | Freq: Once | ORAL | Status: AC
  Administered 2015-04-21: 60 mg via ORAL
  Filled 2015-04-21: qty 3

## 2015-04-21 MED ORDER — PREDNISONE 10 MG (21) PO TBPK: 10.0000 mg | ORAL_TABLET | Freq: Every day | ORAL | Status: DC

## 2015-04-21 MED ORDER — ALBUTEROL SULFATE (2.5 MG/3ML) 0.083% IN NEBU
5.0000 mg | INHALATION_SOLUTION | Freq: Once | RESPIRATORY_TRACT | Status: AC
  Administered 2015-04-21: 5 mg via RESPIRATORY_TRACT

## 2015-04-21 MED ORDER — ALBUTEROL SULFATE HFA 108 (90 BASE) MCG/ACT IN AERS: 1.0000 | INHALATION_SPRAY | Freq: Four times a day (QID) | RESPIRATORY_TRACT | Status: DC | PRN

## 2015-04-21 NOTE — Discharge Instructions (Signed)
Read the information below.  Use the prescribed medication as directed.  Please discuss all new medications with your pharmacist.  You may return to the Emergency Department at any time for worsening condition or any new symptoms that concern you.   If you develop worsening shortness of breath, uncontrolled wheezing, severe chest pain, or fevers despite using tylenol and/or ibuprofen, return for a recheck.     ° ° °Asthma, Adult °Asthma is a recurring condition in which the airways tighten and narrow. Asthma can make it difficult to breathe. It can cause coughing, wheezing, and shortness of breath. Asthma episodes, also called asthma attacks, range from minor to life-threatening. Asthma cannot be cured, but medicines and lifestyle changes can help control it. °CAUSES °Asthma is believed to be caused by inherited (genetic) and environmental factors, but its exact cause is unknown. Asthma may be triggered by allergens, lung infections, or irritants in the air. Asthma triggers are different for each person. Common triggers include:  °· Animal dander. °· Dust mites. °· Cockroaches. °· Pollen from trees or grass. °· Mold. °· Smoke. °· Air pollutants such as dust, household cleaners, hair sprays, aerosol sprays, paint fumes, strong chemicals, or strong odors. °· Cold air, weather changes, and winds (which increase molds and pollens in the air). °· Strong emotional expressions such as crying or laughing hard. °· Stress. °· Certain medicines (such as aspirin) or types of drugs (such as beta-blockers). °· Sulfites in foods and drinks. Foods and drinks that may contain sulfites include dried fruit, potato chips, and sparkling grape juice. °· Infections or inflammatory conditions such as the flu, a cold, or an inflammation of the nasal membranes (rhinitis). °· Gastroesophageal reflux disease (GERD). °· Exercise or strenuous activity. °SYMPTOMS °Symptoms may occur immediately after asthma is triggered or many hours later.  Symptoms include: °· Wheezing. °· Excessive nighttime or early morning coughing. °· Frequent or severe coughing with a common cold. °· Chest tightness. °· Shortness of breath. °DIAGNOSIS  °The diagnosis of asthma is made by a review of your medical history and a physical exam. Tests may also be performed. These may include: °· Lung function studies. These tests show how much air you breathe in and out. °· Allergy tests. °· Imaging tests such as X-rays. °TREATMENT  °Asthma cannot be cured, but it can usually be controlled. Treatment involves identifying and avoiding your asthma triggers. It also involves medicines. There are 2 classes of medicine used for asthma treatment:  °· Controller medicines. These prevent asthma symptoms from occurring. They are usually taken every day. °· Reliever or rescue medicines. These quickly relieve asthma symptoms. They are used as needed and provide short-term relief. °Your health care provider will help you create an asthma action plan. An asthma action plan is a written plan for managing and treating your asthma attacks. It includes a list of your asthma triggers and how they may be avoided. It also includes information on when medicines should be taken and when their dosage should be changed. An action plan may also involve the use of a device called a peak flow meter. A peak flow meter measures how well the lungs are working. It helps you monitor your condition. °HOME CARE INSTRUCTIONS  °· Take medicines only as directed by your health care provider. Speak with your health care provider if you have questions about how or when to take the medicines. °· Use a peak flow meter as directed by your health care provider. Record and keep track of   readings. °· Understand and use the action plan to help minimize or stop an asthma attack without needing to seek medical care. °· Control your home environment in the following ways to help prevent asthma attacks: °· Do not smoke. Avoid being  exposed to secondhand smoke. °· Change your heating and air conditioning filter regularly. °· Limit your use of fireplaces and wood stoves. °· Get rid of pests (such as roaches and mice) and their droppings. °· Throw away plants if you see mold on them. °· Clean your floors and dust regularly. Use unscented cleaning products. °· Try to have someone else vacuum for you regularly. Stay out of rooms while they are being vacuumed and for a short while afterward. If you vacuum, use a dust mask from a hardware store, a double-layered or microfilter vacuum cleaner bag, or a vacuum cleaner with a HEPA filter. °· Replace carpet with wood, tile, or vinyl flooring. Carpet can trap dander and dust. °· Use allergy-proof pillows, mattress covers, and box spring covers. °· Wash bed sheets and blankets every week in hot water and dry them in a dryer. °· Use blankets that are made of polyester or cotton. °· Clean bathrooms and kitchens with bleach. If possible, have someone repaint the walls in these rooms with mold-resistant paint. Keep out of the rooms that are being cleaned and painted. °· Wash hands frequently. °SEEK MEDICAL CARE IF:  °· You have wheezing, shortness of breath, or a cough even if taking medicine to prevent attacks. °· The colored mucus you cough up (sputum) is thicker than usual. °· Your sputum changes from clear or white to yellow, green, gray, or bloody. °· You have any problems that may be related to the medicines you are taking (such as a rash, itching, swelling, or trouble breathing). °· You are using a reliever medicine more than 2-3 times per week. °· Your peak flow is still at 50-79% of your personal best after following your action plan for 1 hour. °· You have a fever. °SEEK IMMEDIATE MEDICAL CARE IF:  °· You seem to be getting worse and are unresponsive to treatment during an asthma attack. °· You are short of breath even at rest. °· You get short of breath when doing very little physical  activity. °· You have difficulty eating, drinking, or talking due to asthma symptoms. °· You develop chest pain. °· You develop a fast heartbeat. °· You have a bluish color to your lips or fingernails. °· You are light-headed, dizzy, or faint. °· Your peak flow is less than 50% of your personal best. °  °This information is not intended to replace advice given to you by your health care provider. Make sure you discuss any questions you have with your health care provider. °  °Document Released: 01/28/2005 Document Revised: 10/19/2014 Document Reviewed: 08/27/2012 °Elsevier Interactive Patient Education ©2016 [employer] ° °Asthma Attack Prevention °While you may not be able to control the fact that you have asthma, you can take actions to prevent asthma attacks. The best way to prevent asthma attacks is to maintain good control of your asthma. You can achieve this by: °· Taking your medicines as directed. °· Avoiding things that can irritate your airways or make your asthma symptoms worse (asthma triggers). °· Keeping track of how well your asthma is controlled and of any changes in your symptoms. °· Responding quickly to worsening asthma symptoms (asthma attack). °· Seeking emergency care when it is needed. °WHAT ARE SOME WAYS TO   PREVENT AN ASTHMA ATTACK? °Have a Plan °Work with your health care provider to create a written plan for managing and treating your asthma attacks (asthma action plan). This plan includes: °· A list of your asthma triggers and how you can avoid them. °· Information on when medicines should be taken and when their dosages should be changed. °· The use of a device that measures how well your lungs are working (peak flow meter). °Monitor Your Asthma °Use your peak flow meter and record your results in a journal every day. A drop in your peak flow numbers on one or more days may indicate the start of an asthma attack. This can happen even before you start to feel symptoms. You can prevent an  asthma attack from getting worse by following the steps in your asthma action plan. °Avoid Asthma Triggers °Work with your asthma health care provider to find out what your asthma triggers are. This can be done by: °· Allergy testing. °· Keeping a journal that notes when asthma attacks occur and the factors that may have contributed to them. °· Determining if there are other medical conditions that are making your asthma worse. °Once you have determined your asthma triggers, take steps to avoid them. This may include avoiding excessive or prolonged exposure to: °· Dust. Have someone dust and vacuum your home for you once or twice a week. Using a high-efficiency particulate arrestance (HEPA) vacuum is best. °· Smoke. This includes campfire smoke, forest fire smoke, and secondhand smoke from tobacco products. °· Pet dander. Avoid contact with animals that you know you are allergic to. °· Allergens from trees, grasses or pollens. Avoid spending a lot of time outdoors when pollen counts are high, and on very windy days. °· Very cold, dry, or humid air. °· Mold. °· Foods that contain high amounts of sulfites. °· Strong odors. °· Outdoor air pollutants, such as engine exhaust. °· Indoor air pollutants, such as aerosol sprays and fumes from household cleaners. °· Household pests, including dust mites and cockroaches, and pest droppings. °· Certain medicines, including NSAIDs. Always talk to your health care provider before stopping or starting any new medicines. °Medicines °Take over-the-counter and prescription medicines only as told by your health care provider. Many asthma attacks can be prevented by carefully following your medicine schedule. Taking your medicines correctly is especially important when you cannot avoid certain asthma triggers. °Act Quickly °If an asthma attack does happen, acting quickly can decrease how severe it is and how long it lasts. Take these steps:  °· Pay attention to your symptoms. If you  are coughing, wheezing, or having difficulty breathing, do not wait to see if your symptoms go away on their own. Follow your asthma action plan. °· If you have followed your asthma action plan and your symptoms are not improving, call your health care provider or seek immediate medical care at the nearest hospital. °It is important to note how often you need to use your fast-acting rescue inhaler. If you are using your rescue inhaler more often, it may mean that your asthma is not under control. Adjusting your asthma treatment plan may help you to prevent future asthma attacks and help you to gain better control of your condition. °HOW CAN I PREVENT AN ASTHMA ATTACK WHEN I EXERCISE? °Follow advice from your health care provider about whether you should use your fast-acting inhaler before exercising. Many people with asthma experience exercise-induced bronchoconstriction (EIB). This condition often worsens during vigorous exercise in cold,   humid, or dry environments. Usually, people with EIB can stay very active by pre-treating with a fast-acting inhaler before exercising. °  °This information is not intended to replace advice given to you by your health care provider. Make sure you discuss any questions you have with your health care provider. °  °Document Released: 01/16/2009 Document Revised: 10/19/2014 Document Reviewed: 06/30/2014 °Elsevier Interactive Patient Education ©2016 [employer] Inc[employer] ° °

## 2015-04-21 NOTE — ED Provider Notes (Signed)
CSN: [id number]     Arrival date & time 04/21/15  0554 History   First MD Initiated Contact with Patient 04/21/15 [phone]     Chief Complaint  Patient presents with  . Asthma     (Consider location/radiation/quality/duration/timing/severity/associated sxs/prior Treatment) The history is provided by the patient.     Pt with hx asthma presents with her typical asthma symptoms.  States with the change in the weather and being outside exposed to smoke yesterday her asthma flared up.  Has had dry cough, wheezing, SOB, chest tightness.  Started using nebulizer machine last night around 9pm, did treatments approximately every hour until 5am when she came to the hospital.  Denies fever, other URI symptoms, recent immobilization, leg swelling, exogenous estrogen.  States this feels like typical asthma symptoms.  Has been admitted many times in the past for asthma.    Past Medical History  Diagnosis Date  . Asthma    History reviewed. No pertinent past surgical history. No family history on file. Social History  Substance Use Topics  . Smoking status: Never Smoker   . Smokeless tobacco: None  . Alcohol Use: No   OB History    No data available     Review of Systems  All other systems reviewed and are negative.     Allergies  Review of patient's allergies indicates no known allergies.  Home Medications   Prior to Admission medications   Medication Sig Start Date End Date Taking? Authorizing Provider  albuterol (PROVENTIL HFA;VENTOLIN HFA) 108 (90 BASE) MCG/ACT inhaler Inhale 1-2 puffs into the lungs every 6 (six) hours as needed for wheezing or shortness of breath.    Historical Provider, MD  albuterol (PROVENTIL) (2.5 MG/3ML) 0.083% nebulizer solution Take 2.5 mg by nebulization every 4 (four) hours as needed for wheezing or shortness of breath.    Historical Provider, MD  Budesonide (PULMICORT FLEXHALER) 90 MCG/ACT inhaler Inhale 2 puffs into the lungs 2 (two) times daily.     Historical Provider, MD  guaiFENesin (MUCINEX) 600 MG 12 hr tablet Take 2 tablets (1,200 mg total) by mouth 2 (two) times daily. 11/11/14   Clydia LlanoMutaz Elmahi, MD  predniSONE (DELTASONE) 10 MG tablet Sig: 4 tables once a day for 3 days, 3 tablets once a day X3 days, 2 tablets a day for 3 days, 1 tablet a day for 3 days. 03/30/15   Tharon AquasFrank C Patrick, PA   BP 122/86 mmHg  Pulse 90  Temp(Src) 98.1 F (36.7 C) (Oral)  Resp 16  Ht 5\' 6"  (1.676 m)  Wt 80.287 kg  BMI 28.58 kg/m2  SpO2 96%  LMP 04/14/2015 Physical Exam  Constitutional: She appears well-developed and well-nourished. No distress.  HENT:  Head: Normocephalic and atraumatic.  Neck: Neck supple.  Cardiovascular: Normal rate.   Pulmonary/Chest: Effort normal. No accessory muscle usage. No tachypnea. No respiratory distress. She has decreased breath sounds. She has wheezes. She has no rhonchi. She has no rales.  Musculoskeletal: She exhibits no edema.  Neurological: She is alert.  Skin: She is not diaphoretic.  Nursing note and vitals reviewed.   ED Course  Procedures (including critical care time) Labs Review Labs Reviewed - No data to display  Imaging Review No results found. I have personally reviewed and evaluated these images and lab results as part of my medical decision-making.   EKG Interpretation None       10:08 AM Great improvement towards end of hour long neb.  Pt reports improvement, she is  moving air much better, and wheezing has decreased significantly.    10:30 AM Lungs clear with only very mild end expiratory wheeze.  Moving air well in all fields.  Maintaining O2 100%.  She is tachycardic as expected following repeat nebulized albuterol treatments.    MDM   Final diagnoses:  Asthma exacerbation    Afebrile, nontoxic patient with typical asthma symptoms.  No risk factors for PE.  No fever, no other sick symptoms, doubt PNA.  Pt given nebs, PO steroids with great improvement.     D/C home with prednisone,  albuterol, PCP follow up prn.  Discussed result, findings, treatment, and follow up  with patient.  Pt given return precautions.  Pt verbalizes understanding and agrees with plan.         Trixie Dredge, PA-C 04/21/15 1104  Cathren Laine, MD 04/24/15 [phone]

## 2015-04-21 NOTE — ED Notes (Signed)
PA ChadWest aware of patients elevated pulse after breathing treatments, PA ChadWest advised patient is still okay to be discharged at this time

## 2015-04-21 NOTE — ED Notes (Signed)
Pt. reports asthma with wheezing and dry cough onset this week unrelieved by MDI , denies fever or chills.

## 2015-05-17 ENCOUNTER — Ambulatory Visit (INDEPENDENT_AMBULATORY_CARE_PROVIDER_SITE_OTHER): Payer: No Typology Code available for payment source | Admitting: Physician Assistant

## 2015-05-17 ENCOUNTER — Telehealth: Payer: Self-pay

## 2015-05-17 VITALS — BP 118/72 | HR 107 | Temp 97.8°F | Resp 17 | Ht 65.5 in | Wt 182.0 lb

## 2015-05-17 DIAGNOSIS — R062 Wheezing: Secondary | ICD-10-CM | POA: Diagnosis not present

## 2015-05-17 DIAGNOSIS — J4540 Moderate persistent asthma, uncomplicated: Secondary | ICD-10-CM

## 2015-05-17 MED ORDER — CETIRIZINE HCL 10 MG PO TABS: 10.0000 mg | ORAL_TABLET | Freq: Every day | ORAL | Status: DC

## 2015-05-17 MED ORDER — FLUTICASONE-SALMETEROL 100-50 MCG/DOSE IN AEPB: 1.0000 | INHALATION_SPRAY | Freq: Two times a day (BID) | RESPIRATORY_TRACT | Status: DC

## 2015-05-17 MED ORDER — ALBUTEROL SULFATE (2.5 MG/3ML) 0.083% IN NEBU
2.5000 mg | INHALATION_SOLUTION | Freq: Once | RESPIRATORY_TRACT | Status: AC
  Administered 2015-05-17: 2.5 mg via RESPIRATORY_TRACT

## 2015-05-17 MED ORDER — PREDNISONE 20 MG PO TABS: ORAL_TABLET | ORAL | Status: DC

## 2015-05-17 MED ORDER — ALBUTEROL SULFATE HFA 108 (90 BASE) MCG/ACT IN AERS: 1.0000 | INHALATION_SPRAY | Freq: Four times a day (QID) | RESPIRATORY_TRACT | Status: DC | PRN

## 2015-05-17 MED ORDER — IPRATROPIUM BROMIDE 0.02 % IN SOLN
0.5000 mg | Freq: Once | RESPIRATORY_TRACT | Status: AC
  Administered 2015-05-17: 0.5 mg via RESPIRATORY_TRACT

## 2015-05-17 MED ORDER — ALBUTEROL SULFATE (2.5 MG/3ML) 0.083% IN NEBU: 2.5000 mg | INHALATION_SOLUTION | Freq: Four times a day (QID) | RESPIRATORY_TRACT | Status: DC | PRN

## 2015-05-17 NOTE — Telephone Encounter (Signed)
Pt is stating that she is at the pharmacy and the medication for the nebulizer has not been called in   Best number [phone]

## 2015-05-17 NOTE — Patient Instructions (Addendum)
IF you received an x-ray today, you will receive an [facility] from [facility]. Please contact [facility] at [phone] with questions or concerns regarding your invoice.   IF you received labwork today, you will receive an invoice from [facility]. Please contact [facility] at [phone] with questions or concerns regarding your invoice.   Our billing staff will not be able to assist you with questions regarding bills from these companies.  You will be contacted with the lab results as soon as they are available. The fastest way to get your results is to activate your My Chart account. Instructions are located on the last page of this paperwork. If you have not heard from us regarding the results in 2 weeks, please contact this office.   Please hydrate well with water I would like you to start the prednisone pack in the mornings. Please let me know, and/or pharmacist to contact to let me know if the advair is not covered well. You will let me know if you experience wheezing after the prednisone pack is done.  This will let me know if the advair is working well alone.  I will send you to pulmonology if this is not a sufficient medication at this time.  Asthma Attack Prevention While you may not be able to control the fact that you have asthma, you can take actions to prevent asthma attacks. The best way to prevent asthma attacks is to maintain good control of your asthma. You can achieve this by:  Taking your medicines as directed.  Avoiding things that can irritate your airways or make your asthma symptoms worse (asthma triggers).  Keeping track of how well your asthma is controlled and of any changes in your symptoms.  Responding quickly to worsening asthma symptoms (asthma attack).  Seeking emergency care when it is needed. WHAT ARE SOME WAYS TO PREVENT AN ASTHMA ATTACK? Have a Plan Work with your health care provider to create a  written plan for managing and treating your asthma attacks (asthma action plan). This plan includes:  A list of your asthma triggers and how you can avoid them.  Information on when medicines should be taken and when their dosages should be changed.  The use of a device that measures how well your lungs are working (peak flow meter). Monitor Your Asthma Use your peak flow meter and record your results in a journal every day. A drop in your peak flow numbers on one or more days may indicate the start of an asthma attack. This can happen even before you start to feel symptoms. You can prevent an asthma attack from getting worse by following the steps in your asthma action plan. Avoid Asthma Triggers Work with your asthma health care provider to find out what your asthma triggers are. This can be done by:  Allergy testing.  Keeping a journal that notes when asthma attacks occur and the factors that may have contributed to them.  Determining if there are other medical conditions that are making your asthma worse. Once you have determined your asthma triggers, take steps to avoid them. This may include avoiding excessive or prolonged exposure to:  Dust. Have someone dust and vacuum your home for you once or twice a week. Using a high-efficiency particulate arrestance (HEPA) vacuum is best.  Smoke. This includes campfire smoke, forest fire smoke, and secondhand smoke from tobacco products.  Pet dander. Avoid contact with animals that you know you are allergic to.  Allergens from trees, grasses or pollens. Avoid spending a lot of time outdoors when pollen counts are high, and on very windy days.  Very cold, dry, or humid air.  Mold.  Foods that contain high amounts of sulfites.  Strong odors.  Outdoor air pollutants, such as Museum/gallery exhibitions officer.  Indoor air pollutants, such as aerosol sprays and fumes from household cleaners.  Household pests, including dust mites and cockroaches, and pest  droppings.  Certain medicines, including NSAIDs. Always talk to your health care provider before stopping or starting any new medicines. Medicines Take over-the-counter and prescription medicines only as told by your health care provider. Many asthma attacks can be prevented by carefully following your medicine schedule. Taking your medicines correctly is especially important when you cannot avoid certain asthma triggers. Act Quickly If an asthma attack does happen, acting quickly can decrease how severe it is and how long it lasts. Take these steps:   Pay attention to your symptoms. If you are coughing, wheezing, or having difficulty breathing, do not wait to see if your symptoms go away on their own. Follow your asthma action plan.  If you have followed your asthma action plan and your symptoms are not improving, call your health care provider or seek immediate medical care at the nearest hospital. It is important to note how often you need to use your fast-acting rescue inhaler. If you are using your rescue inhaler more often, it may mean that your asthma is not under control. Adjusting your asthma treatment plan may help you to prevent future asthma attacks and help you to gain better control of your condition. HOW CAN I PREVENT AN ASTHMA ATTACK WHEN I EXERCISE? Follow advice from your health care provider about whether you should use your fast-acting inhaler before exercising. Many people with asthma experience exercise-induced bronchoconstriction (EIB). This condition often worsens during vigorous exercise in cold, humid, or dry environments. Usually, people with EIB can stay very active by pre-treating with a fast-acting inhaler before exercising.   This information is not intended to replace advice given to you by your health care provider. Make sure you discuss any questions you have with your health care provider.   Document Released: 01/16/2009 Document Revised: 10/19/2014 Document  Reviewed: 06/30/2014 Elsevier Interactive Patient Education [employer]

## 2015-05-17 NOTE — Progress Notes (Signed)
[facility] [address], [place] [id number] [phone]  Date:  05/17/2015   Name:  Raven Holland   DOB:  1995/06/12   MRN:  [id number]  PCP:  No PCP Per Patient    History of Present Illness:  Raven Holland is a 20 y.o. female patient who [facility] for chief complaint of wheezing. Patient has a history of leg asthma exacerbation. She is generally followed by [facility], however due to distance, she has been going to the ED for asthma exacerbations. Patient is using albuterol nebulizers at least 6 times week. She is using her albuterol almost daily as an inhaler. She uses the Pulmicort twice per day as preventative treatment and as prescribed, however since the last 2-3 months her asthma has become out of control as discussed above.  At this time she has allergies to pollen and dander, and other environmental allergies. She is not taking any kind of second generation antihistamine at this time. She reports the only thing that helps to resolve her symptoms is an oral steroid which she gets from the ED.   She she is not a smoker. She is in her childhood she has used Advair, however she does not remember if this was helpful or not.   Patient Active Problem List   Diagnosis Date Noted  . Asthma exacerbation 11/10/2014  . Tachycardia 11/10/2014    Past Medical History  Diagnosis Date  . Asthma     History reviewed. No pertinent past surgical history.  Social History  Substance Use Topics  . Smoking status: Never Smoker   . Smokeless tobacco: None  . Alcohol Use: No    History reviewed. No pertinent family history.  No Known Allergies  Medication list has been reviewed and updated.  Current Outpatient Prescriptions on File Prior to Visit  Medication Sig Dispense Refill  . albuterol (PROVENTIL HFA;VENTOLIN HFA) 108 (90 BASE) MCG/ACT inhaler Inhale 1-2 puffs into the lungs every 6 (six) hours as needed for wheezing or shortness of breath.     Marland Kitchen. albuterol (PROVENTIL HFA;VENTOLIN HFA) 108 (90 Base) MCG/ACT inhaler Inhale 1-2 puffs into the lungs every 6 (six) hours as needed for wheezing or shortness of breath. 1 Inhaler 0  . albuterol (PROVENTIL) (2.5 MG/3ML) 0.083% nebulizer solution Take 2.5 mg by nebulization every 4 (four) hours as needed for wheezing or shortness of breath.    . Budesonide (PULMICORT FLEXHALER) 90 MCG/ACT inhaler Inhale 2 puffs into the lungs 2 (two) times daily.    . predniSONE (STERAPRED UNI-PAK 21 TAB) 10 MG (21) TBPK tablet Take 1 tablet (10 mg total) by mouth daily. Day 1: take 6 tabs.  Day 2: 5 tabs  Day 3: 4 tabs  Day 4: 3 tabs  Day 5: 2 tabs  Day 6: 1 tab (Patient not taking: Reported on 05/17/2015) 21 tablet 0   No current facility-administered medications on file prior to visit.    ROS ROS otherwise unremarkable unless listed above.   Physical Examination: BP 118/72 mmHg  Pulse 107  Temp(Src) 97.8 F (36.6 C) (Oral)  Resp 17  Ht 5' 5.5" (1.664 m)  Wt 182 lb (82.555 kg)  BMI 29.82 kg/m2  SpO2 98%  PF 350 L/min  LMP 04/14/2015 Ideal Body Weight: Weight in (lb) to have BMI = 25: 152.2  Physical Exam  Constitutional: She is oriented to person, place, and time. She appears well-developed and well-nourished. No distress.  HENT:  Head: Normocephalic  and atraumatic.  Right Ear: Tympanic membrane, external ear and ear canal normal.  Left Ear: Tympanic membrane, external ear and ear canal normal.  Nose: Mucosal edema and rhinorrhea present. Right sinus exhibits no maxillary sinus tenderness and no frontal sinus tenderness. Left sinus exhibits no maxillary sinus tenderness and no frontal sinus tenderness.  Mouth/Throat: No uvula swelling. No oropharyngeal exudate, posterior oropharyngeal edema or posterior oropharyngeal erythema.  Eyes: Conjunctivae and EOM are normal. Pupils are equal, round, and reactive to light.  Cardiovascular: Normal rate and regular rhythm.  Exam reveals no gallop, no distant  heart sounds and no friction rub.   No murmur heard. Pulmonary/Chest: Effort normal. No respiratory distress. She has no decreased breath sounds. She has wheezes. She has no rhonchi.  Lymphadenopathy:       Head (right side): No submandibular, no tonsillar, no preauricular and no posterior auricular adenopathy present.       Head (left side): No submandibular, no tonsillar, no preauricular and no posterior auricular adenopathy present.  Neurological: She is alert and oriented to person, place, and time.  Skin: She is not diaphoretic.  Psychiatric: She has a normal mood and affect. Her behavior is normal.     Assessment and Plan: Raven Holland is a 20 y.o. female who is here today for chief complaint of wheezing and medication refill. Her asthma is not well controlled at this time. I have advised that we start her on a LABA with combination of an inhaled steroid.  Perforated acute symptoms we will do an oral prednisone taper. She will let me know if this Advair is difficult to obtain for her. She will also let me know if her symptoms worsen. She has been advised to return to clinic if the Advair does not work without the use of the prednisone. At that point, we will refer her to pulmonology. Asthma, moderate persistent, uncomplicated - Plan: albuterol (PROVENTIL HFA;VENTOLIN HFA) 108 (90 Base) MCG/ACT inhaler, Fluticasone-Salmeterol (ADVAIR) 100-50 MCG/DOSE AEPB, cetirizine (ZYRTEC) 10 MG tablet, predniSONE (DELTASONE) 20 MG tablet, albuterol (PROVENTIL) (2.5 MG/3ML) 0.083% nebulizer solution  Wheezing - Plan: albuterol (PROVENTIL) (2.5 MG/3ML) 0.083% nebulizer solution 2.5 mg, ipratropium (ATROVENT) nebulizer solution 0.5 mg, albuterol (PROVENTIL HFA;VENTOLIN HFA) 108 (90 Base) MCG/ACT inhaler, Fluticasone-Salmeterol (ADVAIR) 100-50 MCG/DOSE AEPB, cetirizine (ZYRTEC) 10 MG tablet, predniSONE (DELTASONE) 20 MG tablet, albuterol (PROVENTIL) (2.5 MG/3ML) 0.083% nebulizer solution   Raven Platt,  PA-C [facility] 05/17/2015 12:07 PM

## 2015-05-18 MED ORDER — ALBUTEROL SULFATE (2.5 MG/3ML) 0.083% IN NEBU: 2.5000 mg | INHALATION_SOLUTION | Freq: Four times a day (QID) | RESPIRATORY_TRACT | Status: DC | PRN

## 2015-05-18 NOTE — Telephone Encounter (Signed)
Re-sent to pharmacy.

## 2015-06-25 ENCOUNTER — Telehealth: Payer: Self-pay | Admitting: Emergency Medicine

## 2015-06-25 NOTE — Telephone Encounter (Signed)
Patient's nebulizer is broken. She asked that I send in a prescription. The medical supply stores are closed today. I did call in an albuterol HFA inhaler to the [facility] on [address]. I advised her to call the office tomorrow to see if we could assist her in getting a new nebulizer. She has been using her Advair.

## 2015-07-10 ENCOUNTER — Emergency Department (HOSPITAL_COMMUNITY): Payer: No Typology Code available for payment source

## 2015-07-10 ENCOUNTER — Encounter (HOSPITAL_COMMUNITY): Payer: Self-pay

## 2015-07-10 ENCOUNTER — Emergency Department (HOSPITAL_COMMUNITY)
Admission: EM | Admit: 2015-07-10 | Discharge: 2015-07-10 | Disposition: A | Discharge status: Home / Self Care | Payer: No Typology Code available for payment source | Attending: Emergency Medicine | Admitting: Emergency Medicine

## 2015-07-10 DIAGNOSIS — R000 Tachycardia, unspecified: Secondary | ICD-10-CM | POA: Insufficient documentation

## 2015-07-10 DIAGNOSIS — R062 Wheezing: Secondary | ICD-10-CM

## 2015-07-10 DIAGNOSIS — R0602 Shortness of breath: Secondary | ICD-10-CM | POA: Diagnosis present

## 2015-07-10 DIAGNOSIS — R61 Generalized hyperhidrosis: Secondary | ICD-10-CM | POA: Insufficient documentation

## 2015-07-10 DIAGNOSIS — J45901 Unspecified asthma with (acute) exacerbation: Secondary | ICD-10-CM | POA: Diagnosis not present

## 2015-07-10 DIAGNOSIS — Z7951 Long term (current) use of inhaled steroids: Secondary | ICD-10-CM | POA: Diagnosis not present

## 2015-07-10 DIAGNOSIS — Z79899 Other long term (current) drug therapy: Secondary | ICD-10-CM | POA: Insufficient documentation

## 2015-07-10 MED ORDER — ALBUTEROL SULFATE (2.5 MG/3ML) 0.083% IN NEBU
INHALATION_SOLUTION | RESPIRATORY_TRACT | Status: AC
  Filled 2015-07-10: qty 3

## 2015-07-10 MED ORDER — IPRATROPIUM BROMIDE 0.02 % IN SOLN
0.5000 mg | Freq: Once | RESPIRATORY_TRACT | Status: AC
  Administered 2015-07-10: 0.5 mg via RESPIRATORY_TRACT
  Filled 2015-07-10: qty 2.5

## 2015-07-10 MED ORDER — PREDNISONE 20 MG PO TABS
60.0000 mg | ORAL_TABLET | Freq: Once | ORAL | Status: AC
  Administered 2015-07-10: 60 mg via ORAL
  Filled 2015-07-10: qty 3

## 2015-07-10 MED ORDER — IPRATROPIUM-ALBUTEROL 0.5-2.5 (3) MG/3ML IN SOLN
3.0000 mL | Freq: Once | RESPIRATORY_TRACT | Status: AC
  Administered 2015-07-10: 3 mL via RESPIRATORY_TRACT
  Filled 2015-07-10: qty 3

## 2015-07-10 MED ORDER — SODIUM CHLORIDE 0.9 % IV BOLUS (SEPSIS)
1000.0000 mL | Freq: Once | INTRAVENOUS | Status: AC
  Administered 2015-07-10: 1000 mL via INTRAVENOUS

## 2015-07-10 MED ORDER — PREDNISONE 50 MG PO TABS: 50.0000 mg | ORAL_TABLET | Freq: Every day | ORAL | Status: DC

## 2015-07-10 MED ORDER — MAGNESIUM SULFATE 2 GM/50ML IV SOLN
2.0000 g | Freq: Once | INTRAVENOUS | Status: AC
  Administered 2015-07-10: 2 g via INTRAVENOUS
  Filled 2015-07-10: qty 50

## 2015-07-10 MED ORDER — ALBUTEROL (5 MG/ML) CONTINUOUS INHALATION SOLN
10.0000 mg/h | INHALATION_SOLUTION | RESPIRATORY_TRACT | Status: DC
Start: 2015-07-10 — End: 2015-07-11
  Administered 2015-07-10 (×2): 10 mg/h via RESPIRATORY_TRACT
  Filled 2015-07-10: qty 20

## 2015-07-10 MED ORDER — ALBUTEROL SULFATE (2.5 MG/3ML) 0.083% IN NEBU
5.0000 mg | INHALATION_SOLUTION | Freq: Once | RESPIRATORY_TRACT | Status: AC
  Administered 2015-07-10: 5 mg via RESPIRATORY_TRACT

## 2015-07-10 NOTE — ED Provider Notes (Signed)
Medical screening examination/treatment/procedure(s) were conducted as a shared visit with non-physician practitioner(s) and myself.  I personally evaluated the patient during the encounter.   EKG Interpretation None     20 y.o. female presents with asthma exacerbation. Not in respiratory failure but has tight bilateral wheezing. Will treat with breathing treatments/mag/steroids and monitor for admission vs discharge depending on response.   See related encounter note   Lyndal Pulleyaniel Jakeira Seeman, MD 07/11/15 [phone]

## 2015-07-10 NOTE — ED Notes (Addendum)
Pt. Stated, "I am having a difficulty breathing."  Informed Margaretha SheffieldAshley Myer and Dr. Clydene PughKnott , they are at the bedside.  Cold wash rags applied to nape of neck and forehead.  Comfort given

## 2015-07-10 NOTE — ED Provider Notes (Signed)
CSN: [id number]     Arrival date & time 07/10/15  1341 History   First MD Initiated Contact with Patient 07/10/15 1558     Chief Complaint  Patient presents with  . Asthma     (Consider location/radiation/quality/duration/timing/severity/associated sxs/prior Treatment) HPI Comments: Raven Holland is a 20 y.o. female presents with history of asthma presents to ED with complaint of shortness of breath and wheezing. Symptoms started yesterday. She was able to achieve relief with at home nebulizer treatment. Early this morning; however, shortness of breath and wheezing worsened and were not improved with home therapy. EMS was called and she was given albuterol and atrovent with improvement. Around 9:30am, shortness of breath and wheezing returned, prompting her to come to the ED. Symptoms include shortness of breath, wheezing, chest tightness, and non-productive cough. Patient denies fever or chills, no headache, dizziness, lightheadedness. Asthma triggers include floral perfumes and heat. No recent long distance travel/immobilization, no history of cancer or cancer treatment, no h/o blood blots, no hemoptysis.   The history is provided by the patient.    Past Medical History  Diagnosis Date  . Asthma    History reviewed. No pertinent past surgical history. No family history on file. Social History  Substance Use Topics  . Smoking status: Never Smoker   . Smokeless tobacco: None  . Alcohol Use: Yes     Comment: occasional   OB History    No data available     Review of Systems  Constitutional: Positive for diaphoresis ( nighttime, chronic). Negative for fever, chills and fatigue.  HENT: Negative for sore throat and trouble swallowing.   Respiratory: Positive for cough ( non-productive), chest tightness, shortness of breath and wheezing.   Cardiovascular: Negative for chest pain and leg swelling.  Gastrointestinal: Negative for nausea, vomiting and abdominal pain.  Genitourinary:  Negative for dysuria and hematuria.  Musculoskeletal: Negative for myalgias, neck pain and neck stiffness.  Skin: Negative for rash.  Allergic/Immunologic: Negative for immunocompromised state.  Neurological: Negative for dizziness, light-headedness and headaches.      Allergies  Review of patient's allergies indicates no known allergies.  Home Medications   Prior to Admission medications   Medication Sig Start Date End Date Taking? Authorizing Provider  albuterol (PROVENTIL HFA;VENTOLIN HFA) 108 (90 Base) MCG/ACT inhaler Inhale 1-2 puffs into the lungs every 6 (six) hours as needed for wheezing or shortness of breath. 05/17/15  Yes Stephanie D English, PA  albuterol (PROVENTIL) (2.5 MG/3ML) 0.083% nebulizer solution Take 3 mLs (2.5 mg total) by nebulization every 6 (six) hours as needed for wheezing or shortness of breath. 05/18/15  Yes Stephanie D English, PA  Fluticasone-Salmeterol (ADVAIR) 100-50 MCG/DOSE AEPB Inhale 1 puff into the lungs 2 (two) times daily. 05/17/15  Yes Collie Siad English, PA  predniSONE (DELTASONE) 50 MG tablet Take 1 tablet (50 mg total) by mouth daily with breakfast. Patient not taking: Reported on 07/11/2015 07/10/15   Lona Kettle, PA-C   BP 131/64 mmHg  Pulse 128  Temp(Src) 97.7 F (36.5 C) (Oral)  Resp 20  Ht  (1.676 m)  Wt 84.823 kg  BMI 30.20 kg/m2  SpO2 96%  LMP 04/29/2015 Physical Exam  Constitutional: She appears well-developed and well-nourished. No distress.  HENT:  Head: Normocephalic and atraumatic.  Mouth/Throat: Oropharynx is clear and moist. No oropharyngeal exudate.  Eyes: Conjunctivae and EOM are normal. Pupils are equal, round, and reactive to light. Right eye exhibits no discharge. Left eye exhibits no discharge. No scleral  icterus.  Neck: Trachea normal and normal range of motion. Neck supple. No rigidity. No tracheal deviation and normal range of motion present.  Cardiovascular: Normal rate, regular rhythm, normal heart  sounds and intact distal pulses.   No murmur heard. Pulmonary/Chest: No accessory muscle usage or stridor. Tachypnea noted. No respiratory distress. She has wheezes ( difffuse expiratory wheezing). She has no rhonchi. She has no rales.  Decreased air movement.   Abdominal: Soft. Bowel sounds are normal. There is no tenderness. There is no rebound and no guarding.  Musculoskeletal: Normal range of motion.  Lymphadenopathy:    She has no cervical adenopathy.  Neurological: She is alert. Coordination normal.  Skin: Skin is warm and dry. She is not diaphoretic.  Psychiatric: She has a normal mood and affect. Her behavior is normal.    ED Course  Procedures (including critical care time) Labs Review Labs Reviewed - No data to display  Imaging Review Dg Chest Port 1 View  07/10/2015  CLINICAL DATA:  20 year old female with asthma or shortness breath for 2 days worse today. Denies chest pain. Initial encounter. EXAM: PORTABLE CHEST 1 VIEW COMPARISON:  11/09/2014. FINDINGS: Exam limited by portable technique and overlying structures. No obvious pneumothorax. Hazy appearance of lung bases may represent overlapping structures/crowding of vasculature although difficult to exclude basilar infiltrates. Patient would benefit from two view chest with better inspiration. Heart size within normal limits. No acute osseous abnormality. IMPRESSION: Hazy appearance of lung bases may represent overlapping structures/crowding of vasculature although difficult to exclude basilar infiltrates. Patient would benefit from two view chest with better inspiration. Electronically Signed   By: Lacy Duverney M.D.   On: 07/10/2015 16:37   I have personally reviewed and evaluated these images and lab results as part of my medical decision-making.   EKG Interpretation None      MDM   Final diagnoses:  Shortness of breath  Wheezing    Patient is afebrile and non-toxic appearing. She is tachycardic, most likely  secondary to albuterol nebulizer. Physical exam remarkable for tachypnea, decreased air movement, and diffuse wheezing. Doubt pneumonia, pleural effusion, or pneumothorax. Well's criteria 0. Suspect asthma exacerbation. Continuous albuterol nebulizer with ipratropium and PO prednisone.   ~4:00PM: Nurse notification that patient is having worsening shortness of breath during nebulizer treatment. She has increased work of breathing. Discussed patient with Dr. Clydene Pugh, who also saw patient. Chest x-ray ordered. Start IV mag. Continue nebulizer treatment.   ~4:40PM: Patient continues to endorse difficulty breathing. She is diaphoretic, tachycardic, and tachypneic with increased work of breathing. O2 sats remain stable above 95%. Wheezing persists. She has cold rags applied to neck. Comfort provided to patient to remain calm and take deep breaths.   ~5:20PM: Patient continues to have labored breathing. She is tachypneic, tachycardic, and sitting in tripod position. Wheezing persists. Nebulizer continuing along with magnesium infusion. Respiratory therapy called and evaluated patient. O2sats remain above 95%.   ~5:40PM: Continuous nebulizer completed. Patient endorses improvement in breathing. Able to speak in sentences. Still mildly tachypneic and wheezing remains; however, slight improvement in air movement. O2sats remain stable above 97-99%. Tachycardic, suspect from albuterol nebulizer. Suspect part of reaction earlier anxiety related. Patient states mask was making her "hot."  ~6:00PM: Patient continues to endorse improvement in breathing. Wheezing persists. She is still tachycardic, O2sats 97-99%. Will give duo-neb.   ~ 8:00PM: Patient continued to endorse improvement in breathing. Wheezing persists. Tachycardia remains. O2sats remain stable. Will give another duo-neb. If continued improvement, believe patient will  be safe for d/c.   10:00PM: Patient endorses improvement in breathing. States she feels  "she could run." She is ambulatory, drinking fluids, able to talk in complete sentences. Wheezing still heard on exam; air movement improved. O2sats remain 98-99%, tachycardic likely due to duo-neb.  Rx for prednisone next 4 days. Continue at home inhaler and nebulizer treatment as needed for relief of shortness of breath and wheezing. Discussed return precautions. Encouraged establishment of PCP for further evaluation and management of asthma as patient may need a step up in therapy. Patient voiced understanding and is agreeable with plan.     Lona Kettleshley Laurel Meyer, [place]-C 07/11/15 1155  Lyndal Pulleyaniel Knott, MD 07/11/15 [phone]

## 2015-07-10 NOTE — ED Notes (Signed)
Pt. Is feeling better, sitting up in bed, resp. Have decreased 22, Oxygen levels are 97%.  Nebulized treatment completed.  Pt. Stated, "I'm feeling better."  Pt. Is able to speak full sentences,.  Breathing improved.

## 2015-07-10 NOTE — ED Notes (Signed)
Ice water given to pt,

## 2015-07-10 NOTE — ED Notes (Signed)
Pt. Sitting up in bed, tripod position , resp 36, oxygen level is 96%,  Pt. Is having labored breathing, reassurance and comfort given.pt. Continues on the Nebulize treatment and Magnesium is infusing.  Cold wash rags applied on her neck.   Margaretha SheffieldAshley, Myer , PA at the bedside, RT at the bedside. This RN at the bedside.

## 2015-07-10 NOTE — ED Notes (Signed)
Per Pt, Pt is coming from home. Pt started to have SOB starting yesterday when she was at home. Pt was using nebulizer treatments every four hours with little relief. This morning, pt had to call EMS and was given two breathing treatment. Reports "It helped for a little bit, but then it got worse again." P has had asthma since she was a baby. Pt alert and orineted x4. Unproductive cough noted with wheezing.

## 2015-07-10 NOTE — ED Notes (Signed)
Visitor w/ pt came to nurses desk and states pt is having trouble with her breathing. RN made aware.

## 2015-07-10 NOTE — Discharge Instructions (Signed)
Read the information below.   Use the prescribed medication as directed.  Please discuss all new medications with your pharmacist.  You are being prescribed steroids. Take 1 tab by mouth for the next 4 days.  Use your inhaler and nebulizer at home as needed for relief of shortness of breath or wheezing.  It is important to establish a primary care provider and I encourage you to follow up in the next three days for re-evaluation. You may need a step up in your therapy.  You may return to the Emergency Department at any time for worsening condition or any new symptoms that concern you. Return to ED if you develop worsening shortness of breath or wheezing, fever, cough, chest tightness, loss of consciousness.

## 2015-07-11 ENCOUNTER — Emergency Department (HOSPITAL_COMMUNITY): Payer: No Typology Code available for payment source

## 2015-07-11 ENCOUNTER — Encounter (HOSPITAL_COMMUNITY): Payer: Self-pay

## 2015-07-11 ENCOUNTER — Emergency Department (HOSPITAL_COMMUNITY)
Admission: EM | Admit: 2015-07-11 | Discharge: 2015-07-11 | Disposition: A | Discharge status: Home / Self Care | Payer: No Typology Code available for payment source | Attending: Emergency Medicine | Admitting: Emergency Medicine

## 2015-07-11 DIAGNOSIS — J45901 Unspecified asthma with (acute) exacerbation: Secondary | ICD-10-CM

## 2015-07-11 DIAGNOSIS — Z3202 Encounter for pregnancy test, result negative: Secondary | ICD-10-CM | POA: Insufficient documentation

## 2015-07-11 DIAGNOSIS — Z79899 Other long term (current) drug therapy: Secondary | ICD-10-CM | POA: Insufficient documentation

## 2015-07-11 DIAGNOSIS — R0600 Dyspnea, unspecified: Secondary | ICD-10-CM | POA: Diagnosis present

## 2015-07-11 DIAGNOSIS — Z7951 Long term (current) use of inhaled steroids: Secondary | ICD-10-CM | POA: Insufficient documentation

## 2015-07-11 LAB — BASIC METABOLIC PANEL
ANION GAP: 8 (ref 5–15)
BUN: 6 mg/dL (ref 6–20)
CALCIUM: 8.5 mg/dL — AB (ref 8.9–10.3)
CO2: 18 mmol/L — AB (ref 22–32)
Chloride: 109 mmol/L (ref 101–111)
Creatinine, Ser: 0.78 mg/dL (ref 0.44–1.00)
Glucose, Bld: 258 mg/dL — ABNORMAL HIGH (ref 65–99)
Potassium: 4.8 mmol/L (ref 3.5–5.1)
SODIUM: 135 mmol/L (ref 135–145)

## 2015-07-11 LAB — I-STAT ARTERIAL BLOOD GAS, ED
ACID-BASE DEFICIT: 10 mmol/L — AB (ref 0.0–2.0)
Bicarbonate: 17.4 mEq/L — ABNORMAL LOW (ref 20.0–24.0)
O2 SAT: 99 %
PH ART: 7.229 — AB (ref 7.350–7.450)
TCO2: 19 mmol/L (ref 0–100)
pCO2 arterial: 41.7 mmHg (ref 35.0–45.0)
pO2, Arterial: 173 mmHg — ABNORMAL HIGH (ref 80.0–100.0)

## 2015-07-11 LAB — CBC WITH DIFFERENTIAL/PLATELET
BASOS PCT: 0 %
Basophils Absolute: 0 10*3/uL (ref 0.0–0.1)
EOS PCT: 0 %
Eosinophils Absolute: 0 10*3/uL (ref 0.0–0.7)
HCT: 37.9 % (ref 36.0–46.0)
HEMOGLOBIN: 13.1 g/dL (ref 12.0–15.0)
LYMPHS ABS: 0.3 10*3/uL — AB (ref 0.7–4.0)
Lymphocytes Relative: 3 %
MCH: 31.2 pg (ref 26.0–34.0)
MCHC: 34.6 g/dL (ref 30.0–36.0)
MCV: 90.2 fL (ref 78.0–100.0)
Monocytes Absolute: 0.1 10*3/uL (ref 0.1–1.0)
Monocytes Relative: 1 %
NEUTROS PCT: 96 %
Neutro Abs: 8.2 10*3/uL — ABNORMAL HIGH (ref 1.7–7.7)
PLATELETS: 214 10*3/uL (ref 150–400)
RBC: 4.2 MIL/uL (ref 3.87–5.11)
RDW: 11.7 % (ref 11.5–15.5)
WBC: 8.5 10*3/uL (ref 4.0–10.5)

## 2015-07-11 LAB — I-STAT BETA HCG BLOOD, ED (MC, WL, AP ONLY)

## 2015-07-11 MED ORDER — MAGNESIUM SULFATE 2 GM/50ML IV SOLN
2.0000 g | Freq: Once | INTRAVENOUS | Status: AC
  Administered 2015-07-11: 2 g via INTRAVENOUS

## 2015-07-11 MED ORDER — ALBUTEROL SULFATE (2.5 MG/3ML) 0.083% IN NEBU
INHALATION_SOLUTION | RESPIRATORY_TRACT | Status: AC
  Filled 2015-07-11: qty 3

## 2015-07-11 MED ORDER — ALBUTEROL (5 MG/ML) CONTINUOUS INHALATION SOLN
10.0000 mg/h | INHALATION_SOLUTION | Freq: Once | RESPIRATORY_TRACT | Status: AC
  Administered 2015-07-11: 10 mg/h via RESPIRATORY_TRACT

## 2015-07-11 NOTE — Discharge Instructions (Signed)

## 2015-07-11 NOTE — ED Notes (Signed)
Pt arrived via [facility] from home c/o resp distress.  Pt was discharged earlier today.  EMS gave 2 duoneb, 125mg  Solumedrol.  EMS states prior to arrival pt had collapsed, they noted seizure like activity when they got there then a period of 5-8 minutes pt was limp.  Pt O2 sats in 70's on arrival.

## 2015-07-11 NOTE — ED Provider Notes (Signed)
CSN: [id number]     Arrival date & time    History  By signing my name below, I, Bethel BornBritney McCollum, attest that this documentation has been prepared under the direction and in the presence of Gilda Creasehristopher J Jermanie Minshall, MD. Electronically Signed: Bethel BornBritney McCollum, ED Scribe. 07/11/2015. 4:57 AM    Chief Complaint  Patient presents with  . Respiratory Distress   The history is provided by the EMS personnel and the patient. No language interpreter was used.   Brought in by EMS from home, Raven MuseMaya Westbrook is a 20 y.o. female who presents to the Emergency Department for evaluation of respiratory distress. The pt was discharged from the ED last night after being treated for an asthma exacerbation. She did not have an opportunity to fill the prednisone that she was prescribed at that time and her breathing significantly worsened when she got home.  Pt denies recent cough or illness and associates her symptoms with the heat. Pt was given 2 duoneb treatment and solumedrol by EMS. Per EMS report the pt had a 30 second episode of shaking and unresponsiveness with an apparent post-ictal phase afterwards. She has no history of seizures.  Past Medical History  Diagnosis Date  . Asthma    History reviewed. No pertinent past surgical history. History reviewed. No pertinent family history. Social History  Substance Use Topics  . Smoking status: Never Smoker   . Smokeless tobacco: None  . Alcohol Use: Yes     Comment: occasional   OB History    No data available     Review of Systems  Constitutional: Negative for fever.  Respiratory: Positive for shortness of breath and wheezing. Negative for cough.   All other systems reviewed and are negative.   Allergies  Review of patient's allergies indicates no known allergies.  Home Medications   Prior to Admission medications   Medication Sig Start Date End Date Taking? Authorizing Provider  albuterol (PROVENTIL HFA;VENTOLIN HFA) 108 (90 Base) MCG/ACT inhaler  Inhale 1-2 puffs into the lungs every 6 (six) hours as needed for wheezing or shortness of breath. 05/17/15  Yes Stephanie D English, PA  albuterol (PROVENTIL) (2.5 MG/3ML) 0.083% nebulizer solution Take 3 mLs (2.5 mg total) by nebulization every 6 (six) hours as needed for wheezing or shortness of breath. 05/18/15  Yes Stephanie D English, PA  Fluticasone-Salmeterol (ADVAIR) 100-50 MCG/DOSE AEPB Inhale 1 puff into the lungs 2 (two) times daily. 05/17/15  Yes Collie SiadStephanie D English, PA  predniSONE (DELTASONE) 50 MG tablet Take 1 tablet (50 mg total) by mouth daily with breakfast. Patient not taking: Reported on 07/11/2015 07/10/15   Lona KettleAshley Laurel Meyer, PA-C   BP 118/53 mmHg  Pulse 124  Resp 28  SpO2 100%  LMP 04/29/2015 Physical Exam  Constitutional: She is oriented to person, place, and time. She appears well-developed and well-nourished. No distress.  HENT:  Head: Normocephalic and atraumatic.  Right Ear: Hearing normal.  Left Ear: Hearing normal.  Nose: Nose normal.  Mouth/Throat: Oropharynx is clear and moist and mucous membranes are normal.  Eyes: Conjunctivae and EOM are normal. Pupils are equal, round, and reactive to light.  Neck: Normal range of motion. Neck supple.  Cardiovascular: Regular rhythm, S1 normal and S2 normal.  Exam reveals no gallop and no friction rub.   No murmur heard. Pulmonary/Chest: Accessory muscle usage present. Tachypnea noted. She has wheezes in the right upper field, the right middle field, the right lower field, the left upper field, the left middle field  and the left lower field. She exhibits no tenderness.  Increased WOB   Abdominal: Soft. Normal appearance and bowel sounds are normal. There is no hepatosplenomegaly. There is no tenderness. There is no rebound, no guarding, no tenderness at McBurney's point and negative Murphy's sign. No hernia.  Musculoskeletal: Normal range of motion.  Neurological: She is alert and oriented to person, place, and time. She has  normal strength. No cranial nerve deficit or sensory deficit. Coordination normal. GCS eye subscore is 4. GCS verbal subscore is 5. GCS motor subscore is 6.  Skin: Skin is warm, dry and intact. No rash noted. No cyanosis.  Psychiatric: She has a normal mood and affect. Her speech is normal and behavior is normal. Thought content normal.  Nursing note and vitals reviewed.   ED Course  Procedures (including critical care time) COORDINATION OF CARE: 12:51 AM Discussed treatment plan which includes lab work, CXR, a nebulizer treatment and magnesium with pt at bedside and pt agreed to plan.  Labs Review Labs Reviewed  BASIC METABOLIC PANEL - Abnormal; Notable for the following:    CO2 18 (*)    Glucose, Bld 258 (*)    Calcium 8.5 (*)    All other components within normal limits  CBC WITH DIFFERENTIAL/PLATELET - Abnormal; Notable for the following:    Neutro Abs 8.2 (*)    Lymphs Abs 0.3 (*)    All other components within normal limits  I-STAT ARTERIAL BLOOD GAS, ED - Abnormal; Notable for the following:    pH, Arterial 7.229 (*)    pO2, Arterial 173.0 (*)    Bicarbonate 17.4 (*)    Acid-base deficit 10.0 (*)    All other components within normal limits  CBC WITH DIFFERENTIAL/PLATELET  I-STAT BETA HCG BLOOD, ED ([facility], [facility], [facility] ONLY)    Imaging Review Dg Chest Port 1 View  07/10/2015  CLINICAL DATA:  20 year old female with asthma or shortness breath for 2 days worse today. Denies chest pain. Initial encounter. EXAM: PORTABLE CHEST 1 VIEW COMPARISON:  11/09/2014. FINDINGS: Exam limited by portable technique and overlying structures. No obvious pneumothorax. Hazy appearance of lung bases may represent overlapping structures/crowding of vasculature although difficult to exclude basilar infiltrates. Patient would benefit from two view chest with better inspiration. Heart size within normal limits. No acute osseous abnormality. IMPRESSION: Hazy appearance of lung bases may represent overlapping  structures/crowding of vasculature although difficult to exclude basilar infiltrates. Patient would benefit from two view chest with better inspiration. Electronically Signed   By: Lacy Duverney M.D.   On: 07/10/2015 16:37   I have personally reviewed and evaluated these images and lab results as part of my medical decision-making.   EKG Interpretation   Date/Time:  Tuesday Jul 11 2015 01:16:31 EDT Ventricular Rate:  125 PR Interval:  122 QRS Duration: 82 QT Interval:  343 QTC Calculation: 495 R Axis:   95 Text Interpretation:  Sinus tachycardia Consider right atrial enlargement  Borderline right axis deviation Borderline prolonged QT interval No  previous tracing Confirmed by Kathleena Freeman  MD, Moranda Billiot [phone]) on  07/11/2015 1:44:15 AM      MDM   Final diagnoses:  None  Asthma exacerbation  Patient presents to the ER for evaluation of difficulty breathing. Patient has a history of asthma. She was seen in the ER earlier and treated for asthma exacerbation. Since going home, however, her symptoms worsened. She arrived in mild respiratory distress with increased respiratory rate, tachycardia, increased work of breathing with wheezing. She  has been given Solu-Medrol and DuoNeb's by EMS. Patient placed on a continuous nebulizer treatment and administered magnesium. Her wheezing has resolved. She was observed for a period of time in the ER and continues to do well. I do not feel she requires admission at this time, can be discharged with continued therapy of prednisone and Procrit dilator.  I personally performed the services described in this documentation, which was scribed in my presence. The recorded information has been reviewed and is accurate.     Gilda Crease, MD 07/11/15 [phone]

## 2015-07-11 NOTE — ED Notes (Signed)
Pt refused xray 

## 2015-07-11 NOTE — ED Notes (Signed)
Pt stable, ambulatory, states understanding of discharge instructions 

## 2015-07-13 ENCOUNTER — Ambulatory Visit (INDEPENDENT_AMBULATORY_CARE_PROVIDER_SITE_OTHER): Payer: No Typology Code available for payment source | Admitting: Family Medicine

## 2015-07-13 VITALS — BP 122/62 | HR 74 | Temp 98.2°F | Resp 18 | Ht 66.0 in | Wt 182.8 lb

## 2015-07-13 DIAGNOSIS — J4540 Moderate persistent asthma, uncomplicated: Secondary | ICD-10-CM | POA: Diagnosis not present

## 2015-07-13 NOTE — Progress Notes (Signed)
   Subjective:    Patient ID: Raven Holland, female    DOB: 09-Jul-1995, 20 y.o.   MRN: [id number]  HPI This is a pleasant 20 yo female who presents today for follow up of asthma. She was seen in the ED 07/10/15 and 07/11/15. She is currently on prednisone 50 mg. She is tolerating well and breathing much improved. She attributes worsening asthma to high heat and stress. She requests a new home nebulizer machine, stating that hers is broken. She has adequate albuterol neb solution at home.   Past Medical History  Diagnosis Date  . Asthma    History reviewed. No pertinent past surgical history. History reviewed. No pertinent family history. Social History  Substance Use Topics  . Smoking status: Never Smoker   . Smokeless tobacco: None  . Alcohol Use: Yes     Comment: occasional   '   Review of Systems No fever or chills, no cough    Objective:   Physical Exam  Constitutional: She is oriented to person, place, and time. She appears well-developed and well-nourished.  HENT:  Head: Normocephalic and atraumatic.  Cardiovascular: Normal rate and regular rhythm.   Pulmonary/Chest: Effort normal. She has wheezes (few scattered expiratory).  Neurological: She is alert and oriented to person, place, and time.  Skin: Skin is warm and dry.  Psychiatric: She has a normal mood and affect. Her behavior is normal. Judgment and thought content normal.  Vitals reviewed.     BP 122/62 mmHg  Pulse 74  Temp(Src) 98.2 F (36.8 C) (Oral)  Resp 18  Ht 5\' 6"  (1.676 m)  Wt 182 lb 12.8 oz (82.918 kg)  BMI 29.52 kg/m2  SpO2 99%  LMP 04/29/2015 Wt Readings from Last 3 Encounters:  07/13/15 182 lb 12.8 oz (82.918 kg)  07/10/15 187 lb (84.823 kg)  05/17/15 182 lb (82.555 kg)       Assessment & Plan:  1. Asthma, moderate persistent, uncomplicated - provided written prescription for home nebulizer machine - encouraged her to finish her prednisone, continue Advair - follow up with Trena PlattStephanie  English, PA-C in about 4 weeks, consider PFTs to establish baseline  Olean Reeeborah Gessner, FNP-BC  [facility]  07/13/2015 4:54 PM

## 2015-07-13 NOTE — Patient Instructions (Addendum)
Please follow up with Trena PlattStephanie English, PA-C in about 4 weeks    IF you received an x-ray today, you will receive an invoice from [facility]. Please contact [facility] at [phone] with questions or concerns regarding your invoice.   IF you received labwork today, you will receive an invoice from [facility]. Please contact [facility] at [phone] with questions or concerns regarding your invoice.   Our billing staff will not be able to assist you with questions regarding bills from these companies.  You will be contacted with the lab results as soon as they are available. The fastest way to get your results is to activate your My Chart account. Instructions are located on the last page of this paperwork. If you have not heard from us regarding the results in 2 weeks, please contact this office.

## 2015-08-12 ENCOUNTER — Other Ambulatory Visit: Payer: Self-pay

## 2015-08-12 DIAGNOSIS — J4540 Moderate persistent asthma, uncomplicated: Secondary | ICD-10-CM

## 2015-08-12 DIAGNOSIS — R062 Wheezing: Secondary | ICD-10-CM

## 2015-08-12 MED ORDER — ALBUTEROL SULFATE (2.5 MG/3ML) 0.083% IN NEBU: 2.5000 mg | INHALATION_SOLUTION | Freq: Four times a day (QID) | RESPIRATORY_TRACT | Status: DC | PRN

## 2015-08-12 NOTE — Telephone Encounter (Signed)
Albuterol 0.083% Neb Sig: 1 vial q/6hrs PRN wheezing SOB

## 2015-08-13 ENCOUNTER — Ambulatory Visit (HOSPITAL_COMMUNITY)
Admission: EM | Admit: 2015-08-13 | Discharge: 2015-08-13 | Disposition: A | Discharge status: Home / Self Care | Payer: No Typology Code available for payment source | Attending: Emergency Medicine | Admitting: Emergency Medicine

## 2015-08-13 ENCOUNTER — Encounter (HOSPITAL_COMMUNITY): Payer: Self-pay | Admitting: Emergency Medicine

## 2015-08-13 DIAGNOSIS — J4540 Moderate persistent asthma, uncomplicated: Secondary | ICD-10-CM | POA: Diagnosis not present

## 2015-08-13 DIAGNOSIS — R062 Wheezing: Secondary | ICD-10-CM

## 2015-08-13 DIAGNOSIS — J45901 Unspecified asthma with (acute) exacerbation: Secondary | ICD-10-CM | POA: Diagnosis not present

## 2015-08-13 MED ORDER — IPRATROPIUM-ALBUTEROL 0.5-2.5 (3) MG/3ML IN SOLN
3.0000 mL | Freq: Once | RESPIRATORY_TRACT | Status: AC
  Administered 2015-08-13: 3 mL via RESPIRATORY_TRACT

## 2015-08-13 MED ORDER — ALBUTEROL SULFATE (2.5 MG/3ML) 0.083% IN NEBU: 2.5000 mg | INHALATION_SOLUTION | Freq: Four times a day (QID) | RESPIRATORY_TRACT | Status: DC | PRN

## 2015-08-13 MED ORDER — IPRATROPIUM-ALBUTEROL 0.5-2.5 (3) MG/3ML IN SOLN
RESPIRATORY_TRACT | Status: AC
  Filled 2015-08-13: qty 3

## 2015-08-13 MED ORDER — METHYLPREDNISOLONE ACETATE 80 MG/ML IJ SUSP
80.0000 mg | Freq: Once | INTRAMUSCULAR | Status: AC
  Administered 2015-08-13: 80 mg via INTRAMUSCULAR

## 2015-08-13 MED ORDER — METHYLPREDNISOLONE ACETATE 80 MG/ML IJ SUSP
INTRAMUSCULAR | Status: AC
  Filled 2015-08-13: qty 1

## 2015-08-13 NOTE — Discharge Instructions (Signed)
We gave you a steroid shot today. This is effective for 48 hours. I've refilled your albuterol nebulizer. You will likely need your albuterol frequently for another 24 hours or so, but should be able to back off after that. Follow-up as needed.

## 2015-08-13 NOTE — ED Notes (Signed)
Breathing issues for 3 days.  Called ems on Friday, ems gave to treatments of albuterol, second one atrovent.  This helped and patient went to get advair.  But is still having issues with asthma today, better, but not normal.  Now, patient is out of albuterol nebulizer medicine.

## 2015-08-13 NOTE — ED Provider Notes (Addendum)
CSN: [id number]     Arrival date & time 08/13/15  1200 History   First MD Initiated Contact with Patient 08/13/15 1230     Chief Complaint  Patient presents with  . Asthma   (Consider location/radiation/quality/duration/timing/severity/associated sxs/prior Treatment) HPI She is a 20 year old woman here for evaluation of asthma. She states her asthma flared up about 2 days ago. He got to the point where she had to call EMS. They gave her 2 breathing treatments which greatly improved her symptoms. She has been taking her Advair twice a day like she is supposed to. She has been using her albuterol about every 3 hours for the last 2 days. She states she is doing a lot better, but still feels a little tight with her breathing. She denies much in the way of wheezing, cough. No shortness of breath. No fevers. She is going out of town tomorrow for several days. She also requests a refill of her albuterol nebulizer as she has just run out. She does still have inhalers.  Past Medical History  Diagnosis Date  . Asthma    History reviewed. No pertinent past surgical history. Family History  Problem Relation Age of Onset  . Asthma Maternal Grandmother   . Asthma Paternal Grandmother    Social History  Substance Use Topics  . Smoking status: Never Smoker   . Smokeless tobacco: None  . Alcohol Use: Yes     Comment: occasional   OB History    No data available     Review of Systems As in history of present illness Allergies  Review of patient's allergies indicates no known allergies.  Home Medications   Prior to Admission medications   Medication Sig Start Date End Date Taking? Authorizing Provider  albuterol (PROVENTIL HFA;VENTOLIN HFA) 108 (90 Base) MCG/ACT inhaler Inhale 1-2 puffs into the lungs every 6 (six) hours as needed for wheezing or shortness of breath. 05/17/15   Collie SiadStephanie D English, PA  albuterol (PROVENTIL) (2.5 MG/3ML) 0.083% nebulizer solution Take 3 mLs (2.5 mg total) by  nebulization every 6 (six) hours as needed for wheezing or shortness of breath. 08/13/15   Charm RingsErin J Stuti Sandin, MD  Fluticasone-Salmeterol (ADVAIR) 100-50 MCG/DOSE AEPB Inhale 1 puff into the lungs 2 (two) times daily. 05/17/15   Garnetta BuddyStephanie D English, PA   Meds Ordered and Administered this Visit   Medications  ipratropium-albuterol (DUONEB) 0.5-2.5 (3) MG/3ML nebulizer solution 3 mL (3 mLs Nebulization Given 08/13/15 1313)  methylPREDNISolone acetate (DEPO-MEDROL) injection 80 mg (80 mg Intramuscular Given 08/13/15 1313)    BP 123/73 mmHg  Pulse 83  Temp(Src) 98.5 F (36.9 C) (Oral)  Resp 16  SpO2 98%  LMP 07/31/2015 No data found.   Physical Exam  Constitutional: She is oriented to person, place, and time. She appears well-developed and well-nourished. No distress.  Neck: Neck supple.  Cardiovascular: Normal rate, regular rhythm and normal heart sounds.   No murmur heard. Pulmonary/Chest: Effort normal and breath sounds normal. No respiratory distress. She has no wheezes. She has no rales.  Slightly decreased air movement in the bases.  Neurological: She is alert and oriented to person, place, and time.    ED Course  Procedures (including critical care time)  Labs Review Labs Reviewed - No data to display  Imaging Review No results found.  MDM   1. Asthma exacerbation   2. Wheezing   3. Asthma, moderate persistent, uncomplicated    Air movement improved after DuoNeb.  I have refilled her albuterol  nebulizer as requested. Depo-Medrol shot will cover her for 48 hours. Follow-up as needed.  I have verbally reviewed the discharge instructions with the patient. A printed AVS was given to the patient.  All questions were answered prior to discharge.    Charm RingsErin J Ellanor Feuerstein, MD 08/13/15 1331  Charm RingsErin J Thatcher Doberstein, MD 08/13/15 1332

## 2015-08-29 ENCOUNTER — Other Ambulatory Visit: Payer: Self-pay | Admitting: Physician Assistant

## 2015-09-16 ENCOUNTER — Other Ambulatory Visit: Payer: Self-pay | Admitting: Physician Assistant

## 2015-09-16 DIAGNOSIS — J4540 Moderate persistent asthma, uncomplicated: Secondary | ICD-10-CM

## 2015-09-16 DIAGNOSIS — R062 Wheezing: Secondary | ICD-10-CM

## 2015-09-16 NOTE — Telephone Encounter (Signed)
Patient sent two requests for inhaler.  She is in need of one now.

## 2015-10-10 ENCOUNTER — Other Ambulatory Visit: Payer: Self-pay | Admitting: Physician Assistant

## 2015-10-10 DIAGNOSIS — J4540 Moderate persistent asthma, uncomplicated: Secondary | ICD-10-CM

## 2015-10-10 DIAGNOSIS — R062 Wheezing: Secondary | ICD-10-CM

## 2015-10-13 ENCOUNTER — Ambulatory Visit (HOSPITAL_COMMUNITY)
Admission: EM | Admit: 2015-10-13 | Discharge: 2015-10-13 | Disposition: A | Discharge status: Home / Self Care | Payer: No Typology Code available for payment source | Attending: Family Medicine | Admitting: Family Medicine

## 2015-10-13 ENCOUNTER — Encounter (HOSPITAL_COMMUNITY): Payer: Self-pay | Admitting: *Deleted

## 2015-10-13 DIAGNOSIS — J45901 Unspecified asthma with (acute) exacerbation: Secondary | ICD-10-CM

## 2015-10-13 MED ORDER — IPRATROPIUM BROMIDE 0.02 % IN SOLN
0.5000 mg | Freq: Once | RESPIRATORY_TRACT | Status: AC
  Administered 2015-10-13: 0.5 mg via RESPIRATORY_TRACT

## 2015-10-13 MED ORDER — IPRATROPIUM BROMIDE 0.02 % IN SOLN
RESPIRATORY_TRACT | Status: AC
  Filled 2015-10-13: qty 2.5

## 2015-10-13 MED ORDER — PREDNISONE 50 MG PO TABS: ORAL_TABLET | ORAL | 0 refills | Status: DC

## 2015-10-13 MED ORDER — METHYLPREDNISOLONE SODIUM SUCC 125 MG IJ SOLR
INTRAMUSCULAR | Status: AC
  Filled 2015-10-13: qty 2

## 2015-10-13 MED ORDER — ALBUTEROL SULFATE (2.5 MG/3ML) 0.083% IN NEBU
INHALATION_SOLUTION | RESPIRATORY_TRACT | Status: AC
  Filled 2015-10-13: qty 6

## 2015-10-13 MED ORDER — ALBUTEROL SULFATE (2.5 MG/3ML) 0.083% IN NEBU
5.0000 mg | INHALATION_SOLUTION | Freq: Once | RESPIRATORY_TRACT | Status: AC
  Administered 2015-10-13: 5 mg via RESPIRATORY_TRACT

## 2015-10-13 MED ORDER — METHYLPREDNISOLONE SODIUM SUCC 125 MG IJ SOLR
125.0000 mg | Freq: Once | INTRAMUSCULAR | Status: AC
  Administered 2015-10-13: 125 mg via INTRAMUSCULAR

## 2015-10-13 NOTE — ED Provider Notes (Signed)
[facility]    CSN: [id number] Arrival date & time: 10/13/15  1004  First Provider Contact:  First MD Initiated Contact with Patient 10/13/15 1039        History   Chief Complaint No chief complaint on file.   HPI Raven Holland is a 20 y.o. female.    Shortness of Breath  Severity:  Mild Onset quality:  Sudden Duration:  1 day Progression:  Worsening Chronicity:  Chronic Context: weather changes   Worsened by:  Activity and deep breathing Associated symptoms: cough and wheezing   Associated symptoms: no fever   Risk factors comment:  Recently ran out of advair med.   Past Medical History:  Diagnosis Date  . Asthma     Patient Active Problem List   Diagnosis Date Noted  . Asthma exacerbation 11/10/2014  . Tachycardia 11/10/2014    No past surgical history on file.  OB History    No data available       Home Medications    Prior to Admission medications   Medication Sig Start Date End Date Taking? Authorizing Provider  ADVAIR DISKUS 100-50 MCG/DOSE AEPB INHALE ONE DOSE BY MOUTH TWICE DAILY 10/10/15   Collie Siad English, PA  albuterol (PROVENTIL HFA;VENTOLIN HFA) 108 (90 Base) MCG/ACT inhaler Inhale 1-2 puffs into the lungs every 6 (six) hours as needed for wheezing or shortness of breath. 05/17/15   Collie Siad English, PA  albuterol (PROVENTIL) (2.5 MG/3ML) 0.083% nebulizer solution Take 3 mLs (2.5 mg total) by nebulization every 6 (six) hours as needed for wheezing or shortness of breath. 08/13/15   Charm Rings, MD    Family History Family History  Problem Relation Age of Onset  . Asthma Maternal Grandmother   . Asthma Paternal Grandmother     Social History Social History  Substance Use Topics  . Smoking status: Never Smoker  . Smokeless tobacco: Not on file  . Alcohol use Yes     Comment: occasional     Allergies   Review of patient's allergies indicates no known allergies.   Review of Systems Review of Systems    Constitutional: Negative.  Negative for fever.  Respiratory: Positive for cough, shortness of breath and wheezing.   Cardiovascular: Negative.   All other systems reviewed and are negative.    Physical Exam Triage Vital Signs ED Triage Vitals [10/13/15 1028]  Enc Vitals Group     BP 122/82     Pulse Rate 100     Resp 16     Temp 98.4 F (36.9 C)     Temp Source Oral     SpO2 96 %     Weight      Height      Head Circumference      Peak Flow      Pain Score      Pain Loc      Pain Edu?      Excl. in GC?    No data found.   Updated Vital Signs BP 122/82 (BP Location: Left Arm)   Pulse 100   Temp 98.4 F (36.9 C) (Oral)   Resp 16   SpO2 96%   Visual Acuity Right Eye Distance:   Left Eye Distance:   Bilateral Distance:    Right Eye Near:   Left Eye Near:    Bilateral Near:     Physical Exam  Constitutional: She is oriented to person, place, and time. She appears well-developed and well-nourished.  HENT:  Head: Normocephalic.  Mouth/Throat: Oropharynx is clear and moist.  Cardiovascular: Normal rate, regular rhythm, normal heart sounds and intact distal pulses.   Pulmonary/Chest: Effort normal. She has wheezes.  Abdominal: Soft.  Neurological: She is alert and oriented to person, place, and time.  Skin: Skin is warm and dry.  Nursing note and vitals reviewed.    UC Treatments / Results  Labs (all labs ordered are listed, but only abnormal results are displayed) Labs Reviewed - No data to display  EKG  EKG Interpretation None       Radiology No results found.  Procedures Procedures (including critical care time)  Medications Ordered in UC Medications  albuterol (PROVENTIL) (2.5 MG/3ML) 0.083% nebulizer solution 5 mg (not administered)  ipratropium (ATROVENT) nebulizer solution 0.5 mg (not administered)  methylPREDNISolone sodium succinate (SOLU-MEDROL) 125 mg/2 mL injection 125 mg (not administered)     Initial Impression / Assessment  and Plan / UC Course  I have reviewed the triage vital signs and the nursing notes.  Pertinent labs & imaging results that were available during my care of the patient were reviewed by me and considered in my medical decision making (see chart for details).  Clinical Course  lungs clear and sx much improved at d/c.    Final Clinical Impressions(s) / UC Diagnoses   Final diagnoses:  None    New Prescriptions New Prescriptions   No medications on file     Linna HoffJames D Miyoshi Ligas, MD 10/13/15 1120

## 2015-10-13 NOTE — ED Triage Notes (Signed)
Pt  Has  Asthma   She  Reports  Shortness  Of  Breath  And  Wheezing  That  Started  Last pm      She  Has  Been  Using  Inhalers       -  She  Reports  A  Cough  As    Well

## 2015-10-17 ENCOUNTER — Emergency Department (HOSPITAL_COMMUNITY)
Admission: EM | Admit: 2015-10-17 | Discharge: 2015-10-17 | Disposition: A | Discharge status: Home / Self Care | Payer: No Typology Code available for payment source | Attending: Emergency Medicine | Admitting: Emergency Medicine

## 2015-10-17 ENCOUNTER — Emergency Department (HOSPITAL_COMMUNITY): Payer: No Typology Code available for payment source

## 2015-10-17 ENCOUNTER — Encounter (HOSPITAL_COMMUNITY): Payer: Self-pay | Admitting: *Deleted

## 2015-10-17 DIAGNOSIS — J45901 Unspecified asthma with (acute) exacerbation: Secondary | ICD-10-CM | POA: Insufficient documentation

## 2015-10-17 DIAGNOSIS — J45909 Unspecified asthma, uncomplicated: Secondary | ICD-10-CM | POA: Diagnosis present

## 2015-10-17 LAB — BASIC METABOLIC PANEL
ANION GAP: 6 (ref 5–15)
BUN: 8 mg/dL (ref 6–20)
CALCIUM: 9.4 mg/dL (ref 8.9–10.3)
CHLORIDE: 106 mmol/L (ref 101–111)
CO2: 27 mmol/L (ref 22–32)
Creatinine, Ser: 0.77 mg/dL (ref 0.44–1.00)
GFR calc non Af Amer: >60 mL/min (ref >60)
Glucose, Bld: 91 mg/dL (ref 65–99)
POTASSIUM: 3.4 mmol/L — AB (ref 3.5–5.1)
Sodium: 139 mmol/L (ref 135–145)

## 2015-10-17 LAB — CBC WITH DIFFERENTIAL/PLATELET
BASOS ABS: 0 10*3/uL (ref 0.0–0.1)
BASOS PCT: 0 %
Eosinophils Absolute: 0.5 10*3/uL (ref 0.0–0.7)
Eosinophils Relative: 9 %
HEMATOCRIT: 41.8 % (ref 36.0–46.0)
HEMOGLOBIN: 14.1 g/dL (ref 12.0–15.0)
Lymphocytes Relative: 63 %
Lymphs Abs: 3.4 10*3/uL (ref 0.7–4.0)
MCH: 31.6 pg (ref 26.0–34.0)
MCHC: 33.7 g/dL (ref 30.0–36.0)
MCV: 93.7 fL (ref 78.0–100.0)
Monocytes Absolute: 0.3 10*3/uL (ref 0.1–1.0)
Monocytes Relative: 6 %
NEUTROS ABS: 1.2 10*3/uL — AB (ref 1.7–7.7)
NEUTROS PCT: 22 %
Platelets: 203 10*3/uL (ref 150–400)
RBC: 4.46 MIL/uL (ref 3.87–5.11)
RDW: 11.9 % (ref 11.5–15.5)
WBC: 5.4 10*3/uL (ref 4.0–10.5)

## 2015-10-17 LAB — I-STAT BETA HCG BLOOD, ED (MC, WL, AP ONLY): I-stat hCG, quantitative: <5 m[IU]/mL (ref <5)

## 2015-10-17 MED ORDER — IPRATROPIUM-ALBUTEROL 0.5-2.5 (3) MG/3ML IN SOLN
3.0000 mL | Freq: Once | RESPIRATORY_TRACT | Status: AC
  Administered 2015-10-17: 3 mL via RESPIRATORY_TRACT
  Filled 2015-10-17: qty 3

## 2015-10-17 MED ORDER — IPRATROPIUM-ALBUTEROL 0.5-2.5 (3) MG/3ML IN SOLN
RESPIRATORY_TRACT | Status: AC
  Filled 2015-10-17: qty 3

## 2015-10-17 MED ORDER — SODIUM CHLORIDE 0.9 % IV SOLN
1000.0000 mL | Freq: Once | INTRAVENOUS | Status: AC
  Administered 2015-10-17: 1000 mL via INTRAVENOUS

## 2015-10-17 MED ORDER — PREDNISONE 50 MG PO TABS: ORAL_TABLET | ORAL | 0 refills | Status: DC

## 2015-10-17 MED ORDER — IPRATROPIUM-ALBUTEROL 0.5-2.5 (3) MG/3ML IN SOLN
3.0000 mL | Freq: Once | RESPIRATORY_TRACT | Status: AC
  Administered 2015-10-17: 3 mL via RESPIRATORY_TRACT

## 2015-10-17 MED ORDER — MAGNESIUM SULFATE 2 GM/50ML IV SOLN
2.0000 g | Freq: Once | INTRAVENOUS | Status: AC
  Administered 2015-10-17: 2 g via INTRAVENOUS
  Filled 2015-10-17: qty 50

## 2015-10-17 NOTE — Discharge Instructions (Signed)
Please follow with your primary care doctor in the next 2 days for a check-up. They must obtain records for further management.  ° °Do not hesitate to return to the Emergency Department for any new, worsening or concerning symptoms.  ° °

## 2015-10-17 NOTE — ED Provider Notes (Signed)
[facility]-EMERGENCY DEPT Provider Note   CSN: [id number] Arrival date & time: 10/17/15  1027     History   Chief Complaint Chief Complaint  Patient presents with  . Asthma    HPI  Blood pressure 92/58, pulse 94, temperature 98 F (36.7 C), temperature source Oral, resp. rate 18, height 5\' 6"  (1.676 m), weight 81.6 kg, last menstrual period 10/11/2015, SpO2 100 %.  Raven Holland is a 20 y.o. female complaining of exacerbation worsening over the last week, she was seen for similar urgent care, she was given a shot of steroids and prednisone burst which she is finished but her shortness of breath and wheezing continues. She denies productive cough, fever, chills, chest pain. Patient with no history of intubations. She's been taking her nebulizer treatment at home with little release, she administered at 4 times yesterday.  PCP: Family practice  HPI  Past Medical History:  Diagnosis Date  . Asthma     Patient Active Problem List   Diagnosis Date Noted  . Asthma exacerbation 11/10/2014  . Tachycardia 11/10/2014    History reviewed. No pertinent surgical history.  OB History    No data available       Home Medications    Prior to Admission medications   Medication Sig Start Date End Date Taking? Authorizing Provider  ADVAIR DISKUS 100-50 MCG/DOSE AEPB INHALE ONE DOSE BY MOUTH TWICE DAILY 10/10/15   Collie Siad English, PA  albuterol (PROVENTIL HFA;VENTOLIN HFA) 108 (90 Base) MCG/ACT inhaler Inhale 1-2 puffs into the lungs every 6 (six) hours as needed for wheezing or shortness of breath. 05/17/15   Collie Siad English, PA  albuterol (PROVENTIL) (2.5 MG/3ML) 0.083% nebulizer solution Take 3 mLs (2.5 mg total) by nebulization every 6 (six) hours as needed for wheezing or shortness of breath. 08/13/15   Charm Rings, MD  predniSONE (DELTASONE) 50 MG tablet Take 1 tablet daily with breakfast 10/17/15   Wynetta Emery, PA-C    Family History Family History  Problem Relation Age of  Onset  . Asthma Maternal Grandmother   . Asthma Paternal Grandmother     Social History Social History  Substance Use Topics  . Smoking status: Never Smoker  . Smokeless tobacco: Not on file  . Alcohol use Yes     Comment: occasional     Allergies   Review of patient's allergies indicates no known allergies.   Review of Systems Review of Systems  10 systems reviewed and found to be negative, except as noted in the HPI.   Physical Exam Updated Vital Signs BP 92/58 (BP Location: Right Arm)   Pulse 94   Temp 98 F (36.7 C) (Oral)   Resp 18   Ht 5\' 6"  (1.676 m)   Wt 81.6 kg   LMP 10/11/2015   SpO2 100%   BMI 29.05 kg/m   Physical Exam  Constitutional: She is oriented to person, place, and time. She appears well-developed and well-nourished. No distress.  HENT:  Head: Normocephalic and atraumatic.  Mouth/Throat: Oropharynx is clear and moist.  Eyes: Conjunctivae and EOM are normal. Pupils are equal, round, and reactive to light.  Neck: Normal range of motion. No JVD present. No tracheal deviation present.  Cardiovascular: Normal rate, regular rhythm and intact distal pulses.   Pulmonary/Chest: Effort normal. No stridor. No respiratory distress. She has wheezes. She has no rales. She exhibits no tenderness.  Excellent air movement in all fields, trace scattered expiratory wheezing.  Abdominal: Soft. She exhibits no distension  and no mass. There is no tenderness. There is no rebound and no guarding.  Musculoskeletal: Normal range of motion. She exhibits no edema or tenderness.  No calf asymmetry, superficial collaterals, palpable cords, edema, Homans sign negative bilaterally.    Neurological: She is alert and oriented to person, place, and time.  Skin: Skin is warm. She is not diaphoretic.  Psychiatric: She has a normal mood and affect.  Nursing note and vitals reviewed.    ED Treatments / Results  Labs (all labs ordered are listed, but only abnormal results  are displayed) Labs Reviewed  CBC WITH DIFFERENTIAL/PLATELET - Abnormal; Notable for the following:       Result Value   Neutro Abs 1.2 (*)    All other components within normal limits  BASIC METABOLIC PANEL - Abnormal; Notable for the following:    Potassium 3.4 (*)    All other components within normal limits  I-STAT BETA HCG BLOOD, ED ([facility], [facility], [facility] ONLY)    EKG  EKG Interpretation  Date/Time:  Tuesday October 17 2015 11:22:48 EDT Ventricular Rate:  81 PR Interval:    QRS Duration: 75 QT Interval:  360 QTC Calculation: 418 R Axis:   87 Text Interpretation:  Unknown rhythm, irregular rate Nonspecific T abnrm, anterolateral leads Baseline wander in lead(s) V4 V6 Confirmed by LIU MD, DANA [id number]) on 10/17/2015 12:14:55 PM       Radiology Dg Chest 2 View  Result Date: 10/17/2015 CLINICAL DATA:  Shortness of breath for 2 days. EXAM: CHEST  2 VIEW COMPARISON:  07/10/2015. FINDINGS: The lungs are clear wiithout focal pneumonia, edema, pneumothorax or pleural effusion. The cardiopericardial silhouette is within normal limits for size. The visualized bony structures of the thorax are intact. Nodular density/densities projecting over the lungs are compatible with pads for telemetry leads. IMPRESSION: No active cardiopulmonary disease. Electronically Signed   By: Kennith CenterEric  Mansell M.D.   On: 10/17/2015 11:39    Procedures Procedures (including critical care time)  Medications Ordered in ED Medications  ipratropium-albuterol (DUONEB) 0.5-2.5 (3) MG/3ML nebulizer solution (not administered)  ipratropium-albuterol (DUONEB) 0.5-2.5 (3) MG/3ML nebulizer solution 3 mL (3 mLs Nebulization Given 10/17/15 1057)  magnesium sulfate IVPB 2 g 50 mL (0 g Intravenous Stopped 10/17/15 1332)  0.9 %  sodium chloride infusion (0 mLs Intravenous Stopped 10/17/15 1332)  ipratropium-albuterol (DUONEB) 0.5-2.5 (3) MG/3ML nebulizer solution 3 mL (3 mLs Nebulization Given 10/17/15 1209)     Initial Impression /  Assessment and Plan / ED Course  I have reviewed the triage vital signs and the nursing notes.  Pertinent labs & imaging results that were available during my care of the patient were reviewed by me and considered in my medical decision making (see chart for details).  Clinical Course    Vitals:   10/17/15 1245 10/17/15 1300 10/17/15 1315 10/17/15 1329  BP: 98/79 101/61 92/58 92/58   Pulse: 96 83 88 94  Resp: 17 16 19 18   Temp:      TempSrc:      SpO2: 96% 95% 94% 100%  Weight:      Height:        Medications  ipratropium-albuterol (DUONEB) 0.5-2.5 (3) MG/3ML nebulizer solution (not administered)  ipratropium-albuterol (DUONEB) 0.5-2.5 (3) MG/3ML nebulizer solution 3 mL (3 mLs Nebulization Given 10/17/15 1057)  magnesium sulfate IVPB 2 g 50 mL (0 g Intravenous Stopped 10/17/15 1332)  0.9 %  sodium chloride infusion (0 mLs Intravenous Stopped 10/17/15 1332)  ipratropium-albuterol (DUONEB) 0.5-2.5 (3) MG/3ML nebulizer  solution 3 mL (3 mLs Nebulization Given 10/17/15 1209)    Raven Holland is 20 y.o. female presenting with Asthma exacerbation, congenitally she just completed a course of prednisone. Lung sounds are actually quite good on my exam. No tachypnea or tachycardia or hypoxia to suggest PE. She does have some mild trace expiratory wheezing, blood work reassuring, chest x-ray without infiltrate. States she feels better after the nebulizer treatment, she states that she has all of her medications that she needs at home, will start on another short course of prednisone. Encouraged her to follow closely with family practice.  Evaluation does not show pathology that would require ongoing emergent intervention or inpatient treatment. Pt is hemodynamically stable and mentating appropriately. Discussed findings and plan with patient/guardian, who agrees with care plan. All questions answered. Return precautions discussed and outpatient follow up given.      Final Clinical Impressions(s) / ED  Diagnoses   Final diagnoses:  Asthma exacerbation      Wynetta Emery, PA-C 10/17/15 1527    Lavera Guise, MD 10/17/15 1911

## 2015-10-17 NOTE — ED Triage Notes (Signed)
Pt states that [facility] and received steroids. States that it has continued and her home albuterol nebulizer are not working wheezing noted in triage.

## 2015-10-17 NOTE — ED Notes (Signed)
Patient came in today with c/o asthma. Hx of asthma. Patient states the difficulty breathing started two nights ago. Patient did neb txs at home and then went to PCP and received steroids. Patient states it helped some but difficulty breathing has remained. Patient does have a cough with clear sputum. Denies chest pain.

## 2015-11-04 ENCOUNTER — Telehealth: Payer: Self-pay | Admitting: *Deleted

## 2015-11-04 ENCOUNTER — Other Ambulatory Visit: Payer: Self-pay | Admitting: *Deleted

## 2015-11-04 DIAGNOSIS — J4540 Moderate persistent asthma, uncomplicated: Secondary | ICD-10-CM

## 2015-11-04 DIAGNOSIS — R062 Wheezing: Secondary | ICD-10-CM

## 2015-11-04 MED ORDER — ALBUTEROL SULFATE HFA 108 (90 BASE) MCG/ACT IN AERS: 1.0000 | INHALATION_SPRAY | Freq: Four times a day (QID) | RESPIRATORY_TRACT | 0 refills | Status: DC | PRN

## 2015-11-04 NOTE — Telephone Encounter (Signed)
Called nebulizer solution into [facility] on [address].

## 2015-11-18 ENCOUNTER — Other Ambulatory Visit: Payer: Self-pay | Admitting: Physician Assistant

## 2015-11-18 DIAGNOSIS — J4540 Moderate persistent asthma, uncomplicated: Secondary | ICD-10-CM

## 2015-11-18 DIAGNOSIS — R062 Wheezing: Secondary | ICD-10-CM

## 2015-11-19 ENCOUNTER — Emergency Department (HOSPITAL_COMMUNITY)
Admission: EM | Admit: 2015-11-19 | Discharge: 2015-11-19 | Disposition: A | Discharge status: Home / Self Care | Payer: No Typology Code available for payment source | Attending: Emergency Medicine | Admitting: Emergency Medicine

## 2015-11-19 ENCOUNTER — Encounter (HOSPITAL_COMMUNITY): Payer: Self-pay

## 2015-11-19 DIAGNOSIS — R062 Wheezing: Secondary | ICD-10-CM

## 2015-11-19 DIAGNOSIS — J45909 Unspecified asthma, uncomplicated: Secondary | ICD-10-CM | POA: Diagnosis present

## 2015-11-19 DIAGNOSIS — J45901 Unspecified asthma with (acute) exacerbation: Secondary | ICD-10-CM

## 2015-11-19 DIAGNOSIS — J4540 Moderate persistent asthma, uncomplicated: Secondary | ICD-10-CM

## 2015-11-19 MED ORDER — PREDNISONE 20 MG PO TABS
60.0000 mg | ORAL_TABLET | Freq: Once | ORAL | Status: AC
  Administered 2015-11-19: 60 mg via ORAL
  Filled 2015-11-19: qty 3

## 2015-11-19 MED ORDER — IPRATROPIUM-ALBUTEROL 0.5-2.5 (3) MG/3ML IN SOLN
3.0000 mL | Freq: Four times a day (QID) | RESPIRATORY_TRACT | Status: DC
  Administered 2015-11-19: 3 mL via RESPIRATORY_TRACT
  Filled 2015-11-19: qty 3

## 2015-11-19 MED ORDER — PREDNISONE 50 MG PO TABS: ORAL_TABLET | ORAL | 0 refills | Status: DC

## 2015-11-19 NOTE — Discharge Instructions (Signed)
Please read and follow all provided instructions.  Your diagnoses today include:  1. Mild asthma with exacerbation, unspecified whether persistent    Tests performed today include: Vital signs. See below for your results today.   Medications prescribed:   Take any prescribed medications only as directed.  Home care instructions:  Follow any educational materials contained in this packet.  Follow-up instructions: Please follow-up with your primary care provider in the next 3 days for further evaluation of your symptoms and management of your asthma.  Return instructions:  Please return to the Emergency Department if you experience worsening symptoms. Please return with worsening wheezing, shortness of breath, or difficulty breathing. Return with persistent fever above 101F.  Please return if you have any other emergent concerns.  Additional Information:  Your vital signs today were: BP 125/82 (BP Location: Left Arm)    Pulse 80    Temp 98.5 F (36.9 C) (Oral)    Resp 18    SpO2 99%  If your blood pressure (BP) was elevated above 135/85 this visit, please have this repeated by your doctor within one month. --------------

## 2015-11-19 NOTE — ED Notes (Signed)
Declined W/C at D/C and was escorted to lobby by RN. 

## 2015-11-19 NOTE — ED Triage Notes (Signed)
Patient complains of increased cough and wheezing for the past several days. Using neb with some relief, here for steroid. Speaking complete sentences, NAD

## 2015-11-19 NOTE — ED Provider Notes (Signed)
[facility] DEPT Provider Note   CSN: [id number] Arrival date & time: 11/19/15  [id number]  By signing my name below, I, Raven Holland, attest that this documentation has been prepared under the direction and in the presence of Audry Piliyler Finnleigh Marchetti PA-C.  Electronically Signed: Vista Minkobert Holland, ED Scribe. 11/19/15. 9:52 AM.   History   Chief Complaint Chief Complaint  Patient presents with  . Asthma    HPI HPI Comments: Raven Holland is a 20 y.o. female with a Hx of asthma, who presents to the Emergency Department complaining of unchanged productive cough and wheezing for the past three days. Pt states she has had mild wheezing for the past few days that has been unrelieved by her rescue inhaler at home. She attempted to use home nebulizer without relief. She believes that the weather change may have triggered this. Pt does not have any pain. No known sick contacts. No rhinorrhea. No other symptoms noted   The history is provided by the patient. No language interpreter was used.    Past Medical History:  Diagnosis Date  . Asthma     Patient Active Problem List   Diagnosis Date Noted  . Asthma exacerbation 11/10/2014  . Tachycardia 11/10/2014    History reviewed. No pertinent surgical history.  OB History    No data available       Home Medications    Prior to Admission medications   Medication Sig Start Date End Date Taking? Authorizing Provider  ADVAIR DISKUS 100-50 MCG/DOSE AEPB INHALE ONE DOSE BY MOUTH TWICE DAILY 10/10/15   Collie SiadStephanie D English, PA  albuterol (PROVENTIL HFA;VENTOLIN HFA) 108 (90 Base) MCG/ACT inhaler Inhale 1-2 puffs into the lungs every 6 (six) hours as needed for wheezing or shortness of breath. Office visit needed for refills 11/04/15   Collie SiadStephanie D English, PA  predniSONE (DELTASONE) 50 MG tablet Take 1 tablet daily with breakfast 10/17/15   Wynetta EmeryNicole Pisciotta, PA-C    Family History Family History  Problem Relation Age of Onset  . Asthma Maternal Grandmother   .  Asthma Paternal Grandmother     Social History Social History  Substance Use Topics  . Smoking status: Never Smoker  . Smokeless tobacco: Not on file  . Alcohol use Yes     Comment: occasional     Allergies   Review of patient's allergies indicates no known allergies.   Review of Systems Review of Systems  HENT: Negative for rhinorrhea.   Respiratory: Positive for cough and wheezing.   All other systems reviewed and are negative.    Physical Exam Updated Vital Signs BP 125/82 (BP Location: Left Arm)   Pulse 80   Temp 98.5 F (36.9 C) (Oral)   Resp 18   SpO2 99%   Physical Exam  Constitutional: She is oriented to person, place, and time. Vital signs are normal. She appears well-developed and well-nourished. No distress.  HENT:  Head: Normocephalic and atraumatic.  Right Ear: Hearing normal.  Left Ear: Hearing normal.  Eyes: Conjunctivae and EOM are normal. Pupils are equal, round, and reactive to light.  Neck: Normal range of motion. Neck supple.  Cardiovascular: Normal rate, regular rhythm, normal heart sounds and intact distal pulses.   Pulmonary/Chest: Effort normal. No tachypnea. No respiratory distress. She has wheezes in the right upper field and the left upper field.  NAD. No Tripoding.   Musculoskeletal: Normal range of motion.  Neurological: She is alert and oriented to person, place, and time.  Skin: Skin is warm and  dry. She is not diaphoretic.  Psychiatric: She has a normal mood and affect. Her speech is normal and behavior is normal. Judgment and thought content normal.  Nursing note and vitals reviewed.  ED Treatments / Results  DIAGNOSTIC STUDIES: Oxygen Saturation is 99% on RA, normal by my interpretation.  COORDINATION OF CARE: 9:48 AM-Will order duo breathing treatment. Discussed treatment plan with pt at bedside and pt agreed to plan.   Labs (all labs ordered are listed, but only abnormal results are displayed) Labs Reviewed - No data to  display  EKG  EKG Interpretation None      Radiology No results found.  Procedures Procedures (including critical care time)  Medications Ordered in ED Medications - No data to display  Initial Impression / Assessment and Plan / ED Course  I have reviewed the triage vital signs and the nursing notes.  Pertinent labs & imaging results that were available during my care of the patient were reviewed by me and considered in my medical decision making (see chart for details).  Clinical Course   Final Clinical Impressions(s) / ED Diagnoses  I have reviewed the relevant previous healthcare records. I obtained HPI from historian.  ED Course:  Assessment: Patient with mild signs and symptoms of asthma/RAD. Oxygen saturation is above 90%. No accessory muscle use, no cyanosis. Treated in the ED.  Patient feels improved after treatment. Will discharge with prednisone burst and refill albuterol. Pt instructed to follow up with PCP. Patient is hemodynamically stable. Discussed return precautions. Pt appears safe for discharge.    Disposition/Plan:  DC Home Additional Verbal discharge instructions given and discussed with patient.  Pt Instructed to f/u with PCP in the next week for evaluation and treatment of symptoms. Return precautions given Pt acknowledges and agrees with plan  Supervising Physician Raeford Razor, MD   Final diagnoses:  Mild asthma with exacerbation, unspecified whether persistent   New Prescriptions New Prescriptions   No medications on file   I personally performed the services described in this documentation, which was scribed in my presence. The recorded information has been reviewed and is accurate.    Audry Pili, PA-C 11/19/15 1008    Raeford Razor, MD 11/21/15 [phone]

## 2015-12-06 ENCOUNTER — Other Ambulatory Visit: Payer: Self-pay | Admitting: Emergency Medicine

## 2015-12-07 NOTE — Telephone Encounter (Signed)
Last seen 07/2015 and using 1 box per month. Denies new cold symptoms or worsening asthma.

## 2015-12-08 ENCOUNTER — Ambulatory Visit (HOSPITAL_COMMUNITY)
Admission: EM | Admit: 2015-12-08 | Discharge: 2015-12-08 | Disposition: A | Discharge status: Home / Self Care | Payer: No Typology Code available for payment source | Attending: Internal Medicine | Admitting: Internal Medicine

## 2015-12-08 ENCOUNTER — Encounter (HOSPITAL_COMMUNITY): Payer: Self-pay | Admitting: Family Medicine

## 2015-12-08 DIAGNOSIS — S0591XA Unspecified injury of right eye and orbit, initial encounter: Secondary | ICD-10-CM | POA: Diagnosis not present

## 2015-12-08 DIAGNOSIS — J4521 Mild intermittent asthma with (acute) exacerbation: Secondary | ICD-10-CM

## 2015-12-08 MED ORDER — METHYLPREDNISOLONE ACETATE 80 MG/ML IJ SUSP
INTRAMUSCULAR | Status: AC
  Filled 2015-12-08: qty 1

## 2015-12-08 MED ORDER — PREDNISONE 20 MG PO TABS: ORAL_TABLET | ORAL | 0 refills | Status: DC

## 2015-12-08 MED ORDER — FLUORESCEIN SODIUM 1 MG OP STRP
1.0000 | ORAL_STRIP | Freq: Once | OPHTHALMIC | Status: AC
  Administered 2015-12-08: 1 via OPHTHALMIC

## 2015-12-08 MED ORDER — TETRACAINE HCL 0.5 % OP SOLN
OPHTHALMIC | Status: AC
  Filled 2015-12-08: qty 2

## 2015-12-08 MED ORDER — FLUORESCEIN SODIUM 1 MG OP STRP
ORAL_STRIP | OPHTHALMIC | Status: AC
  Filled 2015-12-08: qty 1

## 2015-12-08 MED ORDER — METHYLPREDNISOLONE ACETATE 80 MG/ML IJ SUSP
80.0000 mg | Freq: Once | INTRAMUSCULAR | Status: AC
  Administered 2015-12-08: 80 mg via INTRAMUSCULAR

## 2015-12-08 MED ORDER — IPRATROPIUM-ALBUTEROL 0.5-2.5 (3) MG/3ML IN SOLN
3.0000 mL | Freq: Once | RESPIRATORY_TRACT | Status: AC
  Administered 2015-12-08: 3 mL via RESPIRATORY_TRACT

## 2015-12-08 MED ORDER — IPRATROPIUM-ALBUTEROL 0.5-2.5 (3) MG/3ML IN SOLN
RESPIRATORY_TRACT | Status: AC
  Filled 2015-12-08: qty 3

## 2015-12-08 NOTE — ED Provider Notes (Signed)
CSN: [id number]     Arrival date & time 12/08/15  1235 History   First MD Initiated Contact with Patient 12/08/15 1329     Chief Complaint  Patient presents with  . Eye Injury   (Consider location/radiation/quality/duration/timing/severity/associated sxs/prior Treatment) HPI Raven Holland is a 20 y.o. female presenting to UC with c/o Right eye pain that started about 1 hour PTA after she accidentally hit her face on a latch of a dog crate.  Denies change in vision.  Pain has improved significantly since initial injury but she is still having mild soreness to her Right lower eyelid.  Denies bleeding or bruising of eyelid but notes there is some swelling.  She is also requesting a treatment for her asthma. She notes she has had mild intermittent dry cough with wheeze for the last 2-3 days, c/w prior asthma exacerbations. She has used her home albuterol inhaler and nebulizer with minimal relief. She has had steroids before for her asthma and does well. Denies fever, chills, n/v/d. Denies chest pain or SOB at this time.    Past Medical History:  Diagnosis Date  . Asthma    History reviewed. No pertinent surgical history. Family History  Problem Relation Age of Onset  . Asthma Maternal Grandmother   . Asthma Paternal Grandmother    Social History  Substance Use Topics  . Smoking status: Never Smoker  . Smokeless tobacco: Never Used  . Alcohol use Yes     Comment: occasional   OB History    No data available     Review of Systems  Constitutional: Negative for chills and fever.  HENT: Negative for congestion, ear pain, sore throat, trouble swallowing and voice change.   Eyes: Positive for pain ( mild) and redness. Negative for photophobia, discharge, itching and visual disturbance.       Right eye, Right lower eyelid  Respiratory: Positive for cough, chest tightness and wheezing. Negative for shortness of breath.   Cardiovascular: Negative for chest pain and palpitations.   Gastrointestinal: Negative for abdominal pain, diarrhea, nausea and vomiting.  Musculoskeletal: Negative for arthralgias, back pain and myalgias.  Skin: Negative for rash.    Allergies  Review of patient's allergies indicates no known allergies.  Home Medications   Prior to Admission medications   Medication Sig Start Date End Date Taking? Authorizing Provider  ADVAIR DISKUS 100-50 MCG/DOSE AEPB INHALE ONE DOSE BY MOUTH TWICE DAILY....NO MORE REFILLS WITHOUT OFFICE VISIT 11/19/15   Collie Siad English, PA  albuterol (PROVENTIL HFA;VENTOLIN HFA) 108 (90 Base) MCG/ACT inhaler Inhale 1-2 puffs into the lungs every 6 (six) hours as needed for wheezing or shortness of breath. Office visit needed for refills 11/04/15   Collie Siad English, PA  albuterol (PROVENTIL) (2.5 MG/3ML) 0.083% nebulizer solution USE ONE VIAL IN NEBULIZER EVERY 6 HOURS AS NEEDED FOR WHEEZING OR SHORTNESS OF BREATH 12/07/15   Ofilia Neas, PA-C  predniSONE (DELTASONE) 20 MG tablet 3 tabs po day one, then 2 po daily x 4 days 12/08/15   Junius Finner, PA-C  predniSONE (DELTASONE) 50 MG tablet Take 1 tablet daily with breakfast 11/19/15   Audry Pili, PA-C   Meds Ordered and Administered this Visit   Medications  methylPREDNISolone acetate (DEPO-MEDROL) injection 80 mg (80 mg Intramuscular Given 12/08/15 1401)  ipratropium-albuterol (DUONEB) 0.5-2.5 (3) MG/3ML nebulizer solution 3 mL (3 mLs Nebulization Given 12/08/15 1401)  fluorescein ophthalmic strip 1 strip (1 strip Right Eye Given 12/08/15 1401)    BP 118/79  Pulse 84   Temp 98.2 F (36.8 C)   Resp 18   LMP 10/21/2015   SpO2 100%  No data found.   Physical Exam  Constitutional: She is oriented to person, place, and time. She appears well-developed and well-nourished.  HENT:  Head: Normocephalic and atraumatic.  Eyes: Conjunctivae and EOM are normal. Pupils are equal, round, and reactive to light. Right eye exhibits no discharge. Right conjunctiva is not  injected. Right conjunctiva has no hemorrhage.  Right eye: no fluorescein uptake. Right lower eyelid, lateral aspect: mild erythema and edema. Skin in tact. No ecchymosis. Minimally tender.  Neck: Normal range of motion. Neck supple.  Cardiovascular: Normal rate and regular rhythm.   Pulmonary/Chest: Effort normal. No stridor. No respiratory distress. She has wheezes ( diffuse inspiratory and expiratory wheeze). She has no rales.  Wheeze w/o evidence of respiratory distress.  Musculoskeletal: Normal range of motion.  Neurological: She is alert and oriented to person, place, and time.  Skin: Skin is warm and dry.  Psychiatric: She has a normal mood and affect. Her behavior is normal.  Nursing note and vitals reviewed.   Urgent Care Course   Clinical Course    Procedures (including critical care time)  Labs Review Labs Reviewed - No data to display  Imaging Review No results found.   Visual Acuity Review  Right Eye Distance:  20/20 Left Eye Distance:  20/20 Bilateral Distance:  20/15   MDM   1. Right eye injury, initial encounter   2. Mild intermittent asthma with exacerbation    Pt c/o Right eye injury. No evidence of corneal abrasion.  Encouraged use of cool compresses on lower eyelid for comfort.   Wheeze noted on exam. Depomedrol and duoneb  Lungs sounds improved, no wheeze on exam after treatment.  Rx: Prednisone (pt does not need refill of albuterol)  Encouraged f/u with PCP next week if not improving and for continued care of asthma.     Junius Finnerrin O'Malley, PA-C 12/08/15 1520

## 2015-12-08 NOTE — ED Triage Notes (Signed)
Pt here for right eye injury. sts she scraped it on her dogs cage. Slight swelling. sts no vision problems.

## 2015-12-08 NOTE — ED Notes (Addendum)
Visual Acuity:  Left eye 20/20    Right eye 20/20      Both eyes 20/13

## 2015-12-31 ENCOUNTER — Ambulatory Visit (HOSPITAL_COMMUNITY)
Admission: EM | Admit: 2015-12-31 | Discharge: 2015-12-31 | Disposition: A | Discharge status: Home / Self Care | Payer: No Typology Code available for payment source | Attending: Emergency Medicine | Admitting: Emergency Medicine

## 2015-12-31 ENCOUNTER — Encounter (HOSPITAL_COMMUNITY): Payer: Self-pay | Admitting: *Deleted

## 2015-12-31 DIAGNOSIS — J4531 Mild persistent asthma with (acute) exacerbation: Secondary | ICD-10-CM

## 2015-12-31 MED ORDER — IPRATROPIUM BROMIDE 0.02 % IN SOLN
0.5000 mg | Freq: Once | RESPIRATORY_TRACT | Status: AC
  Administered 2015-12-31: 0.5 mg via RESPIRATORY_TRACT

## 2015-12-31 MED ORDER — ALBUTEROL SULFATE (2.5 MG/3ML) 0.083% IN NEBU: 2.5000 mg | INHALATION_SOLUTION | Freq: Four times a day (QID) | RESPIRATORY_TRACT | 12 refills | Status: AC | PRN

## 2015-12-31 MED ORDER — ALBUTEROL SULFATE HFA 108 (90 BASE) MCG/ACT IN AERS: 1.0000 | INHALATION_SPRAY | Freq: Four times a day (QID) | RESPIRATORY_TRACT | 3 refills | Status: AC | PRN

## 2015-12-31 MED ORDER — METHYLPREDNISOLONE SODIUM SUCC 125 MG IJ SOLR
INTRAMUSCULAR | Status: AC
  Filled 2015-12-31: qty 2

## 2015-12-31 MED ORDER — IPRATROPIUM BROMIDE 0.02 % IN SOLN
RESPIRATORY_TRACT | Status: AC
  Filled 2015-12-31: qty 2.5

## 2015-12-31 MED ORDER — ALBUTEROL SULFATE (2.5 MG/3ML) 0.083% IN NEBU
INHALATION_SOLUTION | RESPIRATORY_TRACT | Status: AC
  Filled 2015-12-31: qty 6

## 2015-12-31 MED ORDER — PREDNISONE 20 MG PO TABS
ORAL_TABLET | ORAL | 0 refills | Status: DC
Start: 2015-12-31 — End: 2016-01-25

## 2015-12-31 MED ORDER — ALBUTEROL SULFATE (2.5 MG/3ML) 0.083% IN NEBU
5.0000 mg | INHALATION_SOLUTION | Freq: Once | RESPIRATORY_TRACT | Status: AC
  Administered 2015-12-31: 5 mg via RESPIRATORY_TRACT

## 2015-12-31 MED ORDER — METHYLPREDNISOLONE SODIUM SUCC 125 MG IJ SOLR
125.0000 mg | Freq: Once | INTRAMUSCULAR | Status: AC
  Administered 2015-12-31: 125 mg via INTRAMUSCULAR

## 2015-12-31 MED ORDER — BECLOMETHASONE DIPROPIONATE 40 MCG/ACT IN AERS: 2.0000 | INHALATION_SPRAY | Freq: Two times a day (BID) | RESPIRATORY_TRACT | 12 refills | Status: DC

## 2015-12-31 NOTE — ED Provider Notes (Signed)
CSN: [id number]     Arrival date & time 12/31/15  1209 History   First MD Initiated Contact with Patient 12/31/15 1343     Chief Complaint  Patient presents with  . Asthma   (Consider location/radiation/quality/duration/timing/severity/associated sxs/prior Treatment)  HPI   Patient is a 20 year old female presenting today with acute asthma exacerbation. Patient states that she recently lost her insurance and is no longer able to fill her preventative medications. She uses an inhaled corticosteroid and/or Advair depending on what her status is at the time.  Patient states she's been doing with an asthma exacerbation for the past several days using her home nebulizer machine approximately every 2 hours with limited success. She has been on prednisone twice in the past 6 months. Denies history of recent illness or other significant medical history.  Past Medical History:  Diagnosis Date  . Asthma    History reviewed. No pertinent surgical history. Family History  Problem Relation Age of Onset  . Asthma Maternal Grandmother   . Asthma Paternal Grandmother    Social History  Substance Use Topics  . Smoking status: Never Smoker  . Smokeless tobacco: Never Used  . Alcohol use Yes     Comment: occasional   OB History    No data available     Review of Systems  Constitutional: Negative.  Negative for chills and fever.  HENT: Negative.  Negative for sinus pressure, sneezing and sore throat.   Eyes: Negative.   Respiratory: Positive for cough, shortness of breath and wheezing.   Cardiovascular: Negative.  Negative for chest pain.  Gastrointestinal: Negative.   Endocrine: Negative.   Genitourinary: Negative.   Skin: Negative.  Negative for pallor and rash.  Allergic/Immunologic: Positive for environmental allergies. Negative for food allergies and immunocompromised state.  Neurological: Negative.   Hematological: Negative.   Psychiatric/Behavioral: Negative.     Allergies   Patient has no known allergies.  Home Medications   Prior to Admission medications   Medication Sig Start Date End Date Taking? Authorizing Provider  albuterol (PROVENTIL HFA;VENTOLIN HFA) 108 (90 Base) MCG/ACT inhaler Inhale 1-2 puffs into the lungs every 6 (six) hours as needed for wheezing or shortness of breath. 12/31/15   Servando Salinaatherine H Jasan Doughtie, NP  albuterol (PROVENTIL) (2.5 MG/3ML) 0.083% nebulizer solution Take 3 mLs (2.5 mg total) by nebulization every 6 (six) hours as needed for wheezing or shortness of breath. 12/31/15   Servando Salinaatherine H Jihaad Bruschi, NP  beclomethasone (QVAR) 40 MCG/ACT inhaler Inhale 2 puffs into the lungs 2 (two) times daily. 12/31/15   Servando Salinaatherine H Larence Thone, NP  predniSONE (DELTASONE) 20 MG tablet Take three tablets (60 mg) every day for the first 5 days.  Then take two tablets (40mg  ) for the the next 5 days.  Then take one tablet (20mg ) daily for the remaining four days. 12/31/15   Servando Salinaatherine H Malaya Cagley, NP   Meds Ordered and Administered this Visit   Medications  albuterol (PROVENTIL) (2.5 MG/3ML) 0.083% nebulizer solution 5 mg (5 mg Nebulization Given 12/31/15 1351)  ipratropium (ATROVENT) nebulizer solution 0.5 mg (0.5 mg Nebulization Given 12/31/15 1351)  methylPREDNISolone sodium succinate (SOLU-MEDROL) 125 mg/2 mL injection 125 mg (125 mg Intramuscular Given 12/31/15 1435)    BP (!) 121/45   Pulse 80   Temp 98.6 F (37 C) (Oral)   Resp 18   LMP 11/02/2015 Comment: Very irregular menstrual cycles  SpO2 92%  No data found.   Physical Exam  Constitutional: She appears well-developed and well-nourished. No distress.  HENT:  Head: Normocephalic and atraumatic.  Eyes: Conjunctivae are normal. Pupils are equal, round, and reactive to light. Right eye exhibits no discharge. Left eye exhibits no discharge. No scleral icterus.  Neck: Normal range of motion. Neck supple. No thyromegaly present.  Cardiovascular: Regular rhythm, normal heart sounds and intact distal pulses.   Exam reveals no gallop and no friction rub.   No murmur heard. Pulmonary/Chest: She has wheezes. She has no rales. She exhibits no tenderness.  Patient is not in any acute distress. She is able to speak and talk in full sentences however she does have some obvious dyspnea and I'm able to hear audible wheezes at bedside. Insert oriented x-ray wheezes noted in all fields prior to hand-held nebulizer treatment. O2 sats on room air 92%.  Skin: Skin is warm and dry. Capillary refill takes less than 2 seconds. She is not diaphoretic. No pallor.  Psychiatric: She has a normal mood and affect. Her behavior is normal.  Nursing note and vitals reviewed.   Urgent Care Course   Clinical Course     Procedures (including critical care time)  Labs Review Labs Reviewed - No data to display  Imaging Review No results found.  Patient given 5 mg of albuterol and 0.5 mg Atrovent via hand-held nebulizer. Patient tolerated HHN well. Patient states she feels "much better" increased air exchange noted in all fields. However, patient does still have inspiratory and expiratory wheezes noted throughout. Discussed the need for a long term monitoring and steroid treatment. Patient states she does not wish to go to the ED for additional monitoring she is starting a new job tomorrow. Patient states that she will begin prednisone treatment immediately and call 911 or return to ED should she start to encounter additional respiratory distress.    MDM   1. Mild persistent asthma with exacerbation    Meds ordered this encounter  Medications  . albuterol (PROVENTIL) (2.5 MG/3ML) 0.083% nebulizer solution 5 mg  . ipratropium (ATROVENT) nebulizer solution 0.5 mg  . albuterol (PROVENTIL) (2.5 MG/3ML) 0.083% nebulizer solution    Sig: Take 3 mLs (2.5 mg total) by nebulization every 6 (six) hours as needed for wheezing or shortness of breath.    Dispense:  75 mL    Refill:  12  . albuterol (PROVENTIL HFA;VENTOLIN HFA)  108 (90 Base) MCG/ACT inhaler    Sig: Inhale 1-2 puffs into the lungs every 6 (six) hours as needed for wheezing or shortness of breath.    Dispense:  1 Inhaler    Refill:  3  . beclomethasone (QVAR) 40 MCG/ACT inhaler    Sig: Inhale 2 puffs into the lungs 2 (two) times daily.    Dispense:  1 Inhaler    Refill:  12  . predniSONE (DELTASONE) 20 MG tablet    Sig: Take three tablets (60 mg) every day for the first 5 days.  Then take two tablets (40mg  ) for the the next 5 days.  Then take one tablet (20mg ) daily for the remaining four days.    Dispense:  29 tablet    Refill:  0  . methylPREDNISolone sodium succinate (SOLU-MEDROL) 125 mg/2 mL injection 125 mg    The usual and customary discharge instructions and warnings were given.  The patient verbalizes understanding and agrees to plan of care.      Servando Salinaatherine H Sayre Mazor, NP 12/31/15 1441

## 2015-12-31 NOTE — ED Triage Notes (Signed)
C/O asthma flare-up x 5-6 days with mild cold sxs.  Has been using albuterol without any significant relief.  Denies fevers.

## 2015-12-31 NOTE — Discharge Instructions (Signed)
Use your home nebulizer as directed. Complete all the prednisone.  You have been given refills for all your medications.  Obtain insurance as soon as you are able and [facility]

## 2016-01-25 ENCOUNTER — Emergency Department (HOSPITAL_COMMUNITY)
Admission: EM | Admit: 2016-01-25 | Discharge: 2016-01-25 | Disposition: A | Discharge status: Home / Self Care | Payer: No Typology Code available for payment source | Attending: Emergency Medicine | Admitting: Emergency Medicine

## 2016-01-25 ENCOUNTER — Encounter (HOSPITAL_COMMUNITY): Payer: Self-pay | Admitting: Emergency Medicine

## 2016-01-25 DIAGNOSIS — J4521 Mild intermittent asthma with (acute) exacerbation: Secondary | ICD-10-CM | POA: Insufficient documentation

## 2016-01-25 MED ORDER — DEXAMETHASONE SODIUM PHOSPHATE 10 MG/ML IJ SOLN
10.0000 mg | Freq: Once | INTRAMUSCULAR | Status: DC
  Filled 2016-01-25: qty 1

## 2016-01-25 MED ORDER — PREDNISONE 20 MG PO TABS: 40.0000 mg | ORAL_TABLET | Freq: Every day | ORAL | 0 refills | Status: DC

## 2016-01-25 MED ORDER — PREDNISONE 20 MG PO TABS
60.0000 mg | ORAL_TABLET | Freq: Once | ORAL | Status: AC
  Administered 2016-01-25: 60 mg via ORAL
  Filled 2016-01-25: qty 3

## 2016-01-25 MED ORDER — IPRATROPIUM BROMIDE 0.02 % IN SOLN
0.5000 mg | Freq: Once | RESPIRATORY_TRACT | Status: AC
  Administered 2016-01-25: 0.5 mg via RESPIRATORY_TRACT
  Filled 2016-01-25: qty 2.5

## 2016-01-25 MED ORDER — ALBUTEROL SULFATE (2.5 MG/3ML) 0.083% IN NEBU
5.0000 mg | INHALATION_SOLUTION | Freq: Once | RESPIRATORY_TRACT | Status: AC
  Administered 2016-01-25: 5 mg via RESPIRATORY_TRACT
  Filled 2016-01-25: qty 6

## 2016-01-25 MED ORDER — METHYLPREDNISOLONE SODIUM SUCC 125 MG IJ SOLR
125.0000 mg | Freq: Once | INTRAMUSCULAR | Status: DC
  Filled 2016-01-25: qty 2

## 2016-01-25 NOTE — ED Triage Notes (Signed)
Pt here for increased use of home neb treatments during the night; no obvious distress noted at present

## 2016-01-25 NOTE — ED Triage Notes (Signed)
Pt requesting PO steroids

## 2016-01-25 NOTE — Discharge Instructions (Signed)
Take your steroids as prescribed until completed. I recommend continuing to use her home inhaler and neb treatments as needed for shortness of breath and wheezing. Continue to keep fluids at home to remain hydrated. I recommend following up with your family doctor within the next week for reevaluation. Please return to the Emergency Department if symptoms worsen or new onset of fever, coughing up blood, difficulty breathing, chest pain, vomiting, unable to keep fluids down.

## 2016-01-25 NOTE — ED Provider Notes (Signed)
[facility]-EMERGENCY DEPT Provider Note   CSN: [id number] Arrival date & time: 01/25/16  1051  By signing my name below, I, Raven Holland, attest that this documentation has been prepared under the direction and in the presence of Melburn HakeNicole Muzamil Harker, PA-C. Electronically Signed: Sonum Holland, Neurosurgeoncribe. 01/25/16. 11:32 AM.  History   Chief Complaint Chief Complaint  Patient presents with  . Asthma    The history is provided by the patient. No language interpreter was used.     HPI Comments: Raven Holland is a 20 y.o. female who presents to the Emergency Department complaining of gradually worsening SOB and wheezing with associated chest tightness for the past few days. She has a history of asthma and states this typically happens with weather changes. She has been using her nebulizer breathing treatments since yesterday; states yesterday she was able to go 3 hours between treatments but today she has only been able to go 2 hours between. She has also tried using an Advair inhaler without significant relief. She had URI symptoms last week that have since resolved. She denies recent steroid use. She denies fever, cough, rhinorrhea, nasal congestion, ear pain, sore throat, hemoptysis, abdominal pain, vomiting, lightheadedness.    Past Medical History:  Diagnosis Date  . Asthma     Patient Active Problem List   Diagnosis Date Noted  . Asthma exacerbation 11/10/2014  . Tachycardia 11/10/2014    History reviewed. No pertinent surgical history.  OB History    No data available       Home Medications    Prior to Admission medications   Medication Sig Start Date End Date Taking? Authorizing Provider  albuterol (PROVENTIL HFA;VENTOLIN HFA) 108 (90 Base) MCG/ACT inhaler Inhale 1-2 puffs into the lungs every 6 (six) hours as needed for wheezing or shortness of breath. 12/31/15   Servando Salinaatherine H Rossi, NP  albuterol (PROVENTIL) (2.5 MG/3ML) 0.083% nebulizer solution Take 3 mLs (2.5 mg total) by  nebulization every 6 (six) hours as needed for wheezing or shortness of breath. 12/31/15   Servando Salinaatherine H Rossi, NP  beclomethasone (QVAR) 40 MCG/ACT inhaler Inhale 2 puffs into the lungs 2 (two) times daily. 12/31/15   Servando Salinaatherine H Rossi, NP  predniSONE (DELTASONE) 20 MG tablet Take 2 tablets (40 mg total) by mouth daily. 01/25/16   Barrett HenleNicole Elizabeth Olivene Cookston, PA-C    Family History Family History  Problem Relation Age of Onset  . Asthma Maternal Grandmother   . Asthma Paternal Grandmother     Social History Social History  Substance Use Topics  . Smoking status: Never Smoker  . Smokeless tobacco: Never Used  . Alcohol use Yes     Comment: occasional     Allergies   Patient has no known allergies.   Review of Systems Review of Systems  Respiratory: Positive for chest tightness, shortness of breath and wheezing. Negative for cough.   Gastrointestinal: Negative for abdominal pain and vomiting.  Neurological: Negative for light-headedness.     Physical Exam Updated Vital Signs BP 134/70 (BP Location: Left Arm)   Pulse 98   Temp 98.1 F (36.7 C) (Oral)   Resp 22   LMP 11/02/2015 Comment: Very irregular menstrual cycles  SpO2 96%   Physical Exam  Constitutional: She is oriented to person, place, and time. She appears well-developed and well-nourished.  HENT:  Head: Normocephalic and atraumatic.  Mouth/Throat: Uvula is midline, oropharynx is clear and moist and mucous membranes are normal. No oropharyngeal exudate, posterior oropharyngeal edema, posterior oropharyngeal erythema or tonsillar  abscesses. No tonsillar exudate.  Eyes: Conjunctivae and EOM are normal. Right eye exhibits no discharge. Left eye exhibits no discharge. No scleral icterus.  Neck: Normal range of motion. Neck supple.  Cardiovascular: Normal rate, regular rhythm, normal heart sounds and intact distal pulses.   Pulmonary/Chest: Effort normal. No respiratory distress. She has wheezes. She has no rales. She  exhibits no tenderness.  Mild inspiratory and expiratory wheezes bilaterally.   Abdominal: Soft. She exhibits no distension.  Musculoskeletal: She exhibits no edema.  Lymphadenopathy:    She has no cervical adenopathy.  Neurological: She is alert and oriented to person, place, and time.  Skin: Skin is warm and dry.  Nursing note and vitals reviewed.    ED Treatments / Results  DIAGNOSTIC STUDIES: Oxygen Saturation is 96% on RA, adequate by my interpretation.    COORDINATION OF CARE: 11:30 AM Discussed treatment plan with pt at bedside and pt agreed to plan.   Labs (all labs ordered are listed, but only abnormal results are displayed) Labs Reviewed - No data to display  EKG  EKG Interpretation None       Radiology No results found.  Procedures Procedures (including critical care time)  Medications Ordered in ED Medications  albuterol (PROVENTIL) (2.5 MG/3ML) 0.083% nebulizer solution 5 mg (5 mg Nebulization Given 01/25/16 1129)  ipratropium (ATROVENT) nebulizer solution 0.5 mg (0.5 mg Nebulization Given 01/25/16 1129)  predniSONE (DELTASONE) tablet 60 mg (60 mg Oral Given 01/25/16 1140)     Initial Impression / Assessment and Plan / ED Course  I have reviewed the triage vital signs and the nursing notes.  Pertinent labs & imaging results that were available during my care of the patient were reviewed by me and considered in my medical decision making (see chart for details).  Clinical Course     Patient with mild signs and symptoms of asthma. Oxygen saturation is above 90%. No accessory muscle use, no cyanosis. Treated in the ED.  Patient feels improved after treatment. Will discharge with prednisone And advised patient to continue taking her home inhaler and neb treatments as needed. Pt instructed to follow up with PCP. Patient is hemodynamically stable. Discussed return precautions. Pt appears safe for discharge.     Final Clinical Impressions(s) / ED Diagnoses    Final diagnoses:  Mild intermittent asthma with exacerbation    New Prescriptions New Prescriptions   PREDNISONE (DELTASONE) 20 MG TABLET    Take 2 tablets (40 mg total) by mouth daily.    I personally performed the services described in this documentation, which was scribed in my presence. The recorded information has been reviewed and is accurate.    Satira Sarkicole Elizabeth [place] Point[place]adeau, [place]PA-C 01/25/16 1218    Lyndal Pulleyaniel Knott, MD 01/26/16 [phone]

## 2016-02-04 ENCOUNTER — Encounter (HOSPITAL_COMMUNITY): Payer: Self-pay | Admitting: Emergency Medicine

## 2016-02-04 ENCOUNTER — Emergency Department (HOSPITAL_COMMUNITY)
Admission: EM | Admit: 2016-02-04 | Discharge: 2016-02-04 | Disposition: A | Discharge status: Home / Self Care | Payer: No Typology Code available for payment source | Attending: Emergency Medicine | Admitting: Emergency Medicine

## 2016-02-04 DIAGNOSIS — J4521 Mild intermittent asthma with (acute) exacerbation: Secondary | ICD-10-CM | POA: Diagnosis not present

## 2016-02-04 DIAGNOSIS — J45901 Unspecified asthma with (acute) exacerbation: Secondary | ICD-10-CM | POA: Diagnosis present

## 2016-02-04 MED ORDER — ALBUTEROL SULFATE (2.5 MG/3ML) 0.083% IN NEBU
5.0000 mg | INHALATION_SOLUTION | Freq: Once | RESPIRATORY_TRACT | Status: AC
  Administered 2016-02-04: 5 mg via RESPIRATORY_TRACT
  Filled 2016-02-04: qty 6

## 2016-02-04 MED ORDER — ALBUTEROL SULFATE HFA 108 (90 BASE) MCG/ACT IN AERS
2.0000 | INHALATION_SPRAY | Freq: Once | RESPIRATORY_TRACT | Status: AC
  Administered 2016-02-04: 2 via RESPIRATORY_TRACT
  Filled 2016-02-04: qty 6.7

## 2016-02-04 MED ORDER — PREDNISONE 20 MG PO TABS
60.0000 mg | ORAL_TABLET | Freq: Once | ORAL | Status: AC
  Administered 2016-02-04: 60 mg via ORAL
  Filled 2016-02-04: qty 3

## 2016-02-04 MED ORDER — IPRATROPIUM BROMIDE 0.02 % IN SOLN
0.5000 mg | Freq: Once | RESPIRATORY_TRACT | Status: AC
  Administered 2016-02-04: 0.5 mg via RESPIRATORY_TRACT
  Filled 2016-02-04: qty 2.5

## 2016-02-04 NOTE — ED Triage Notes (Signed)
Pt sts asthma sx x 2 days; pt sts using home neds

## 2016-02-04 NOTE — ED Provider Notes (Signed)
[facility] Provider Note   CSN: [id number] Arrival date & time: 02/04/16  0732     History   Chief Complaint Chief Complaint  Patient presents with  . Asthma    HPI Raven MuseMaya Holland is a 20 y.o. female.  HPI   Patient is a 20 year old female with history of asthma presents to the ED with complaint of asthma exacerbation, onset yesterday. Patient reports that the change of weather she recently had worsening asthma. Reports having associated wheezing and shortness of breath. Patient reports using her home neb treatment with yesterday with mild intermittent relief. Patient reports she was seen in the ED on 12/14 for similar asthma exacerbation, was given prednisone which she completed and had improvement of her symptoms until the weather changed a few days ago causing her symptoms to exacerbate. Patient reports she was taking Advair daily but notes when she lost insurance in October she has been unable to afford the medication which is caused her to have worsening asthma exacerbations. Denies fever, chills, headache, nasal congestion, rhinorrhea, cough, sore throat, chest pain, abdominal pain, vomiting. Patient reports she is out of her albuterol inhaler at home.  Past Medical History:  Diagnosis Date  . Asthma     Patient Active Problem List   Diagnosis Date Noted  . Asthma exacerbation 11/10/2014  . Tachycardia 11/10/2014    History reviewed. No pertinent surgical history.  OB History    No data available       Home Medications    Prior to Admission medications   Medication Sig Start Date End Date Taking? Authorizing Provider  albuterol (PROVENTIL HFA;VENTOLIN HFA) 108 (90 Base) MCG/ACT inhaler Inhale 1-2 puffs into the lungs every 6 (six) hours as needed for wheezing or shortness of breath. 12/31/15   Servando Salinaatherine H Rossi, NP  albuterol (PROVENTIL) (2.5 MG/3ML) 0.083% nebulizer solution Take 3 mLs (2.5 mg total) by nebulization every 6 (six) hours as needed for  wheezing or shortness of breath. 12/31/15   Servando Salinaatherine H Rossi, NP  beclomethasone (QVAR) 40 MCG/ACT inhaler Inhale 2 puffs into the lungs 2 (two) times daily. 12/31/15   Servando Salinaatherine H Rossi, NP  predniSONE (DELTASONE) 20 MG tablet Take 2 tablets (40 mg total) by mouth daily. 01/25/16   Barrett HenleNicole Elizabeth Nadeau, PA-C    Family History Family History  Problem Relation Age of Onset  . Asthma Maternal Grandmother   . Asthma Paternal Grandmother     Social History Social History  Substance Use Topics  . Smoking status: Never Smoker  . Smokeless tobacco: Never Used  . Alcohol use Yes     Comment: occasional     Allergies   Patient has no known allergies.   Review of Systems Review of Systems  Respiratory: Positive for shortness of breath and wheezing.   All other systems reviewed and are negative.    Physical Exam Updated Vital Signs BP 105/73 (BP Location: Right Arm)   Pulse 81   Temp 98.4 F (36.9 C) (Oral)   Resp 18   SpO2 97%   Physical Exam  Constitutional: She is oriented to person, place, and time. She appears well-developed and well-nourished. No distress.  HENT:  Head: Normocephalic and atraumatic.  Nose: Nose normal.  Mouth/Throat: Uvula is midline, oropharynx is clear and moist and mucous membranes are normal. No oropharyngeal exudate, posterior oropharyngeal edema, posterior oropharyngeal erythema or tonsillar abscesses. No tonsillar exudate.  Eyes: Conjunctivae and EOM are normal. Right eye exhibits no discharge. Left eye exhibits no discharge.  No scleral icterus.  Neck: Normal range of motion. Neck supple.  Cardiovascular: Normal rate, regular rhythm, normal heart sounds and intact distal pulses.   Pulmonary/Chest: Effort normal. No respiratory distress. She has wheezes. She has no rales. She exhibits no tenderness.  Expiratory wheezes noted bilaterally, worse in by basilar lobes.  Abdominal: Soft. She exhibits no distension. There is no tenderness.    Musculoskeletal: She exhibits no edema.  Neurological: She is alert and oriented to person, place, and time.  Skin: Skin is warm and dry. She is not diaphoretic.  Nursing note and vitals reviewed.    ED Treatments / Results  Labs (all labs ordered are listed, but only abnormal results are displayed) Labs Reviewed - No data to display  EKG  EKG Interpretation None       Radiology No results found.  Procedures Procedures (including critical care time)  Medications Ordered in ED Medications  albuterol (PROVENTIL HFA;VENTOLIN HFA) 108 (90 Base) MCG/ACT inhaler 2 puff (not administered)  predniSONE (DELTASONE) tablet 60 mg (60 mg Oral Given 02/04/16 0826)  albuterol (PROVENTIL) (2.5 MG/3ML) 0.083% nebulizer solution 5 mg (5 mg Nebulization Given 02/04/16 0827)  ipratropium (ATROVENT) nebulizer solution 0.5 mg (0.5 mg Nebulization Given 02/04/16 0827)     Initial Impression / Assessment and Plan / ED Course  I have reviewed the triage vital signs and the nursing notes.  Pertinent labs & imaging results that were available during my care of the patient were reviewed by me and considered in my medical decision making (see chart for details).  Clinical Course     Patient ambulated in ED with O2 saturations maintained >90, no current signs of respiratory distress. Lung exam improved after nebulizer treatment. Prednisone given in the ED but due to recent use of steroids  a week ago, will not d/c pt home with steroids. Patient given albuterol inhaler in the ED to take home. Pt states they are breathing at baseline. Pt has been instructed to continue using prescribed medications and to speak with PCP about today's exacerbation.  Discussed return precautions.  Final Clinical Impressions(s) / ED Diagnoses   Final diagnoses:  Mild intermittent asthma with exacerbation    New Prescriptions New Prescriptions   No medications on file     Barrett Henleicole Elizabeth Nadeau, PA-C 02/04/16  [id number]    Linwood DibblesJon Knapp, MD 02/04/16 [phone]

## 2016-02-04 NOTE — Discharge Instructions (Signed)
Continue using your albuterol inhaler and neb treatments at home as needed for wheezing or shortness of breath. Please follow up with a primary care provider from the Resource Guide provided below in one week for reevaluation and further management of her asthma. Please return to the Emergency Department if symptoms worsen or new onset of fever, headache, coughing up blood, wheezing, shortness of breath, chest pain, vomiting, unable to keep fluids down.

## 2016-02-04 NOTE — ED Triage Notes (Signed)
PT reports using the Neb treatment 4-6 times in last 24 hrs. Pt reports no relief form wheezing..Marland Kitchen

## 2016-02-04 NOTE — ED Notes (Signed)
Declined W/C at D/C and was escorted to lobby by RN. 

## 2016-02-07 ENCOUNTER — Encounter (HOSPITAL_COMMUNITY): Payer: Self-pay | Admitting: Emergency Medicine

## 2016-02-07 ENCOUNTER — Ambulatory Visit (HOSPITAL_COMMUNITY)
Admission: EM | Admit: 2016-02-07 | Discharge: 2016-02-07 | Disposition: A | Discharge status: Home / Self Care | Payer: No Typology Code available for payment source | Attending: Internal Medicine | Admitting: Internal Medicine

## 2016-02-07 DIAGNOSIS — J45901 Unspecified asthma with (acute) exacerbation: Secondary | ICD-10-CM | POA: Diagnosis not present

## 2016-02-07 MED ORDER — IPRATROPIUM BROMIDE 0.02 % IN SOLN: 0.5000 mg | Freq: Four times a day (QID) | RESPIRATORY_TRACT | 12 refills | Status: AC | PRN

## 2016-02-07 MED ORDER — METHYLPREDNISOLONE ACETATE 80 MG/ML IJ SUSP
INTRAMUSCULAR | Status: AC
  Filled 2016-02-07: qty 1

## 2016-02-07 MED ORDER — IPRATROPIUM-ALBUTEROL 0.5-2.5 (3) MG/3ML IN SOLN
RESPIRATORY_TRACT | Status: AC
  Filled 2016-02-07: qty 3

## 2016-02-07 MED ORDER — IPRATROPIUM-ALBUTEROL 0.5-2.5 (3) MG/3ML IN SOLN
3.0000 mL | Freq: Once | RESPIRATORY_TRACT | Status: AC
  Administered 2016-02-07: 3 mL via RESPIRATORY_TRACT

## 2016-02-07 MED ORDER — METHYLPREDNISOLONE ACETATE 80 MG/ML IJ SUSP
80.0000 mg | Freq: Once | INTRAMUSCULAR | Status: AC
  Administered 2016-02-07: 80 mg via INTRAMUSCULAR

## 2016-02-07 MED ORDER — PREDNISONE 10 MG (21) PO TBPK: 10.0000 mg | ORAL_TABLET | Freq: Every day | ORAL | 0 refills | Status: DC

## 2016-02-07 NOTE — ED Triage Notes (Signed)
PT reports asthma exacerbation sent her to the ED last week. PT was given a neb, one dose of prednisone, and D/C'd with an inhaler. PT reports she is using nebulizer treatments every 3 hours today.

## 2016-02-07 NOTE — Discharge Instructions (Signed)
You have been given a medicine in clinic called a DuoNeb, it is a combination of two drugs, Albuterol and Atrovent. Additionally, you have been given an injection of steroids for wheezing and shortness of breath. Continue your home use of albuterol nebulized as needed not more than once every 4 hours. You have been given an additional nebulized medication called atrovent (Ipratropium) do not use this medicine more than once every 6 hours.  You have also been given a steroid dose pack. Recommend taking an antihistamine daily for allergies, when your insurance situation is resolved in January, recommend following up with your primary care provider to start a daily controller medication.

## 2016-02-07 NOTE — ED Provider Notes (Signed)
CSN: [id number]     Arrival date & time 02/07/16  1008 History   First MD Initiated Contact with Patient 02/07/16 1058     Chief Complaint  Patient presents with  . Asthma   (Consider location/radiation/quality/duration/timing/severity/associated sxs/prior Treatment) 20 year old female presents to clinic with chief complaint of asthma exacerbation. Patient states that she lost her insurance in January and has been unable to take a daily controller medicine. She has had multiple asthma exacerbations since and has been seen in the ER twice in the last month for asthma. Patient reports she is using her albuterol nebulizer every 3 hours without relief. She does not smoke and is not around environmental triggers. Patient reports she is going to try to purchase a controller medicine in January.   The history is provided by the patient.  Asthma  Associated symptoms include shortness of breath. Pertinent negatives include no chest pain, no abdominal pain and no headaches.    Past Medical History:  Diagnosis Date  . Asthma    History reviewed. No pertinent surgical history. Family History  Problem Relation Age of Onset  . Asthma Maternal Grandmother   . Asthma Paternal Grandmother    Social History  Substance Use Topics  . Smoking status: Never Smoker  . Smokeless tobacco: Never Used  . Alcohol use Yes     Comment: occasional   OB History    No data available     Review of Systems  Constitutional: Negative for chills, fatigue and fever.  HENT: Negative for congestion, facial swelling, postnasal drip, rhinorrhea, sinus pain and sinus pressure.   Respiratory: Positive for cough, shortness of breath and wheezing. Negative for choking and stridor.   Cardiovascular: Negative for chest pain.  Gastrointestinal: Negative for abdominal pain, diarrhea, nausea and vomiting.  Neurological: Negative for dizziness, weakness and headaches.    Allergies  Patient has no known allergies.  Home  Medications   Prior to Admission medications   Medication Sig Start Date End Date Taking? Authorizing Provider  albuterol (PROVENTIL HFA;VENTOLIN HFA) 108 (90 Base) MCG/ACT inhaler Inhale 1-2 puffs into the lungs every 6 (six) hours as needed for wheezing or shortness of breath. 12/31/15   Servando Salinaatherine H Rossi, NP  albuterol (PROVENTIL) (2.5 MG/3ML) 0.083% nebulizer solution Take 3 mLs (2.5 mg total) by nebulization every 6 (six) hours as needed for wheezing or shortness of breath. 12/31/15   Servando Salinaatherine H Rossi, NP  beclomethasone (QVAR) 40 MCG/ACT inhaler Inhale 2 puffs into the lungs 2 (two) times daily. 12/31/15   Servando Salinaatherine H Rossi, NP  ipratropium (ATROVENT) 0.02 % nebulizer solution Take 2.5 mLs (0.5 mg total) by nebulization every 6 (six) hours as needed for wheezing or shortness of breath. 02/07/16   Dorena BodoLawrence Rylynne Schicker, NP  predniSONE (DELTASONE) 20 MG tablet Take 2 tablets (40 mg total) by mouth daily. 01/25/16   Barrett HenleNicole Elizabeth Nadeau, PA-C  predniSONE (STERAPRED UNI-PAK 21 TAB) 10 MG (21) TBPK tablet Take 1 tablet (10 mg total) by mouth daily. Take 4 tablets a day for 3 days, 3 tablets a day for three days, 2 tablets a day for 2 days, 1 tablet for 1 day. 02/07/16   Dorena BodoLawrence Hani Patnode, NP   Meds Ordered and Administered this Visit   Medications  methylPREDNISolone acetate (DEPO-MEDROL) injection 80 mg (not administered)  ipratropium-albuterol (DUONEB) 0.5-2.5 (3) MG/3ML nebulizer solution 3 mL (not administered)    BP (!) 103/52   Pulse 90   Temp 99.1 F (37.3 C) (Oral)  Resp 16   Ht 5\' 6"  (1.676 m)   Wt 195 lb (88.5 kg)   SpO2 98%   BMI 31.47 kg/m  No data found.   Physical Exam  Constitutional: She is oriented to person, place, and time. She appears well-developed.  HENT:  Head: Normocephalic.  Right Ear: External ear normal.  Left Ear: External ear normal.  Mouth/Throat: Oropharynx is clear and moist.  Eyes: Pupils are equal, round, and reactive to light.  Neck: Normal  range of motion. Neck supple. No JVD present.  Cardiovascular: Normal rate and normal heart sounds.   Pulmonary/Chest: Effort normal. No respiratory distress. She has wheezes in the right upper field, the right middle field, the right lower field, the left upper field, the left middle field and the left lower field.  Abdominal: Soft. Bowel sounds are normal. She exhibits no distension. There is no guarding.  Lymphadenopathy:    She has no cervical adenopathy.  Neurological: She is alert and oriented to person, place, and time.  Skin: Skin is warm and dry. Capillary refill takes less than 2 seconds. No pallor.  Psychiatric: She has a normal mood and affect.  Nursing note and vitals reviewed.   Urgent Care Course   Clinical Course     Procedures (including critical care time)  Labs Review Labs Reviewed - No data to display  Imaging Review No results found.   Visual Acuity Review  Right Eye Distance:   Left Eye Distance:   Bilateral Distance:    Right Eye Near:   Left Eye Near:    Bilateral Near:         MDM   1. Exacerbation of persistent asthma, unspecified asthma severity    Patient is a poorly controlled asthma patient. Patient given DuoNeb treatment in clinic along with a methylprednisolone injection. Patient states she has sufficient albuterol nebulized medicine alone with a rescue inhaler at home. Patient given an rx of atrovent 500 mcg, not more than once every 6 hours as needed to use with her albuterol as needed along with an rx for a prednisone taper. Patient encouraged to follow up with her primary care provider for asthma management and for daily controller medicine. Patient advised to take daily 2nd generation antihistamine for allergy control.     Dorena BodoLawrence Aleanna Menge, NP 02/07/16 1136

## 2016-02-17 ENCOUNTER — Emergency Department (HOSPITAL_COMMUNITY)
Admission: EM | Admit: 2016-02-17 | Discharge: 2016-02-17 | Disposition: A | Discharge status: Home / Self Care | Payer: No Typology Code available for payment source | Attending: Emergency Medicine | Admitting: Emergency Medicine

## 2016-02-17 ENCOUNTER — Encounter (HOSPITAL_COMMUNITY): Payer: Self-pay | Admitting: *Deleted

## 2016-02-17 ENCOUNTER — Emergency Department (HOSPITAL_COMMUNITY): Payer: No Typology Code available for payment source

## 2016-02-17 DIAGNOSIS — Z79899 Other long term (current) drug therapy: Secondary | ICD-10-CM | POA: Insufficient documentation

## 2016-02-17 DIAGNOSIS — J45901 Unspecified asthma with (acute) exacerbation: Secondary | ICD-10-CM | POA: Insufficient documentation

## 2016-02-17 MED ORDER — ALBUTEROL SULFATE (2.5 MG/3ML) 0.083% IN NEBU
5.0000 mg | INHALATION_SOLUTION | Freq: Once | RESPIRATORY_TRACT | Status: AC
  Administered 2016-02-17: 5 mg via RESPIRATORY_TRACT
  Filled 2016-02-17: qty 6

## 2016-02-17 MED ORDER — MOMETASONE FURO-FORMOTEROL FUM 100-5 MCG/ACT IN AERO
2.0000 | INHALATION_SPRAY | Freq: Two times a day (BID) | RESPIRATORY_TRACT | Status: DC
  Administered 2016-02-17: 2 via RESPIRATORY_TRACT
  Filled 2016-02-17: qty 8.8

## 2016-02-17 MED ORDER — PREDNISONE 20 MG PO TABS: 20.0000 mg | ORAL_TABLET | ORAL | 0 refills | Status: AC

## 2016-02-17 MED ORDER — PREDNISONE 20 MG PO TABS
60.0000 mg | ORAL_TABLET | Freq: Once | ORAL | Status: AC
  Administered 2016-02-17: 60 mg via ORAL
  Filled 2016-02-17: qty 3

## 2016-02-17 NOTE — Care Management Note (Signed)
Case Management Note  Patient Details  Name: Raven Holland MRN: [id number] Date of Birth: 25-Sep-1995  Subjective/Objective: 21 y.o. F seen in the ED for Asthma Exacerbation. CM contacted by MD for assistance with PCP follow up and medication assistance. Pt has also had multiple EDVs in the past month for Asthma.                     Action/Plan: Discussed services offered at [facility], including but not limited to, PCP, Medications and assistance with Insurance. Discussed Open Clinic hours and need to go to clinic on Thursday 02/22/16 @ 08:30. Pt understands she is not to leave clinic until seen by PCP or given appt. Dr Clydene PughKnott is to give pt her inhalers used  here today and if further assist with medications is needed CM [facility]. Pt also agreeable to have [facility] her multiple visits so she can be followed by phone. Will contact Robyne PeersJane Brazeau. No further CM needs at this time.     Expected Discharge Date:                  Expected Discharge Plan:  Home/Self Care  In-House Referral:  PCP / [facility]  Discharge planning Services  CM Consult  Post Acute Care Choice:  NA Choice offered to:  Patient  DME Arranged:  N/A DME Agency:  NA  HH Arranged:  NA HH Agency:  NA  Status of Service:  Completed, signed off  If discussed at Long Length of Stay Meetings, dates discussed:    Additional Comments:  Yvone NeuCrutchfield, Delson Dulworth M, RN 02/17/2016, 10:08 AM

## 2016-02-17 NOTE — Discharge Instructions (Signed)
Please [facility] the [facility] inhaler every day.  Please make sure you have an appointment with the community health and wellness clinic to establish with a primary care provider.  Please finish your steroid taper. It will be as follows: 40 mg for 3 days, 20 mg for 2 days and 10 mg for 2 days.  Again, you're to take the [facility] every day regardless of if you're having shortness of breath or not as it is a preventative medication. Please avoid sick contacts and cold weather as much as possible.

## 2016-02-17 NOTE — ED Provider Notes (Signed)
[facility]-EMERGENCY DEPT Provider Note   CSN: [id number] Arrival date & time: 02/17/16  [id number]     History   Chief Complaint Chief Complaint  Patient presents with  . Asthma    HPI Raven Holland is a 21 y.o. female.  The patient is a 21 year old African-American female with history of poorly controlled asthma with 4 presentations to the emergency department over the previous 6 weeks for asthma exacerbations who presents today with cough and shortness of breath consistent with asthma exacerbation. She stated that her lungs felt junky on Thursday of this week. She did develop a cough with some sputum production. Following the development of this cough the patient began to have increased shortness of breath which was not responding to home asthma management.  Her shortness of breath lasted yesterday and prompted her to present to the emergency department today. Currently, she is endorsing ongoing shortness of breath and mild productive cough. She denies chest pain. She denies nausea, vomiting or abdominal pain. She denies constipation or diarrhea. She denies fevers or chills. She does endorse having several sick coworkers that had recently been diagnosed with the flu and upper respiratory tract infections. She may have been exposed to one of these viruses as a trigger to an asthma exacerbation. She has no additional acute complaints or concerns at today's visit.      Past Medical History:  Diagnosis Date  . Asthma     Patient Active Problem List   Diagnosis Date Noted  . Asthma exacerbation 11/10/2014  . Tachycardia 11/10/2014    History reviewed. No pertinent surgical history.  OB History    No data available       Home Medications    Prior to Admission medications   Medication Sig Start Date End Date Taking? Authorizing Provider  albuterol (PROVENTIL HFA;VENTOLIN HFA) 108 (90 Base) MCG/ACT inhaler Inhale 1-2 puffs into the lungs every 6 (six) hours as needed for wheezing or  shortness of breath. 12/31/15  Yes Servando Salinaatherine H Rossi, NP  albuterol (PROVENTIL) (2.5 MG/3ML) 0.083% nebulizer solution Take 3 mLs (2.5 mg total) by nebulization every 6 (six) hours as needed for wheezing or shortness of breath. 12/31/15  Yes Servando Salinaatherine H Rossi, NP  beclomethasone (QVAR) 40 MCG/ACT inhaler Inhale 2 puffs into the lungs 2 (two) times daily. 12/31/15  Yes Servando Salinaatherine H Rossi, NP  ipratropium (ATROVENT) 0.02 % nebulizer solution Take 2.5 mLs (0.5 mg total) by nebulization every 6 (six) hours as needed for wheezing or shortness of breath. 02/07/16  Yes Dorena BodoLawrence Kennard, NP  predniSONE (STERAPRED UNI-PAK 21 TAB) 10 MG (21) TBPK tablet Take 1 tablet (10 mg total) by mouth daily. Take 4 tablets a day for 3 days, 3 tablets a day for three days, 2 tablets a day for 2 days, 1 tablet for 1 day. 02/07/16  Yes Dorena BodoLawrence Kennard, NP    Family History Family History  Problem Relation Age of Onset  . Asthma Maternal Grandmother   . Asthma Paternal Grandmother     Social History Social History  Substance Use Topics  . Smoking status: Never Smoker  . Smokeless tobacco: Never Used  . Alcohol use Yes     Comment: occasional     Allergies   Patient has no known allergies.   Review of Systems Review of Systems  All other systems reviewed and are negative.    Physical Exam Updated Vital Signs BP 115/67   Pulse 109   Temp 98.2 F (36.8 C) (Oral)   Resp  24   LMP 02/11/2016   SpO2 94%   Physical Exam  Constitutional: She is oriented to person, place, and time. She appears well-developed and well-nourished.  Sitting up in bed.  HENT:  Head: Normocephalic and atraumatic.  Cardiovascular: Regular rhythm.  Exam reveals no friction rub.   No murmur heard. Mildly tachycardic on auscultation  Pulmonary/Chest: She has wheezes.  Patient with bilateral expiratory wheezes. She does appear to be moving good air in all lung fields. I will reassess after albuterol treatment.  Abdominal:  Soft. She exhibits no distension. There is no tenderness.  Musculoskeletal: She exhibits no edema.  Neurological: She is alert and oriented to person, place, and time.     ED Treatments / Results  Labs (all labs ordered are listed, but only abnormal results are displayed) Labs Reviewed - No data to display  EKG  EKG Interpretation None       Radiology Dg Chest 2 View  Result Date: 02/17/2016 CLINICAL DATA:  21 year old female with history of asthma exacerbation. Labored breathing and wheezing. EXAM: CHEST  2 VIEW COMPARISON:  Chest x-ray 10/17/2015. FINDINGS: Lung volumes are normal. No consolidative airspace disease. No pleural effusions. No pneumothorax. No pulmonary nodule or mass noted. Pulmonary vasculature and the cardiomediastinal silhouette are within normal limits. IMPRESSION: No radiographic evidence of acute cardiopulmonary disease. Electronically Signed   By: Trudie Reed M.D.   On: 02/17/2016 09:07    Procedures Procedures (including critical care time)  Medications Ordered in ED Medications  mometasone-formoterol (DULERA) 100-5 MCG/ACT inhaler 2 puff (2 puffs Inhalation Given 02/17/16 0944)  albuterol (PROVENTIL) (2.5 MG/3ML) 0.083% nebulizer solution 5 mg (5 mg Nebulization Given 02/17/16 0843)  predniSONE (DELTASONE) tablet 60 mg (60 mg Oral Given 02/17/16 0943)  albuterol (PROVENTIL) (2.5 MG/3ML) 0.083% nebulizer solution 5 mg (5 mg Nebulization Given 02/17/16 1031)     Initial Impression / Assessment and Plan / ED Course  I have reviewed the triage vital signs and the nursing notes.  Pertinent labs & imaging results that were available during my care of the patient were reviewed by me and considered in my medical decision making (see chart for details).  Clinical Course     In the emergency department the patient was afebrile but tachycardic and tachypnic. She was treated with an albuterol nebulizer and given 60 mg of oral prednisone. Two-view diagnostic chest  radiograph did not show evidence of consolidation, pneumonia or other cardiopulmonary abnormality. Repeat evaluation of her pulmonary examination demonstrated improvement in all lung fields after albuterol nebulizer treatment. She did still have mild bilateral expiratory wheezes but improved from prior. We will prescribe a prednisone taper at discharge for the patient to complete over the following week. Additionally, we'll send the patient home on a controller medication Dulera which she will use on a daily basis. Before discharge she was given 1 additional albuterol nebulizer with continued improvement in her respiratory status. She was then stable for discharge with follow-up with the community health and wellness clinic at [facility]. Again, she has been prescribed a controller medication Dulera which she will use in addition to albuterol as breakthrough as necessary at home.  Final Clinical Impressions(s) / ED Diagnoses   Final diagnoses:  Exacerbation of persistent asthma, unspecified asthma severity    New Prescriptions New Prescriptions   No medications on file     Thomasene Lot, MD 02/17/16 1059    Lyndal Pulley, MD 02/17/16 2118

## 2016-02-17 NOTE — ED Notes (Signed)
Patient transported to X-ray 

## 2016-02-17 NOTE — ED Triage Notes (Signed)
Pt c/o asthma exacerbation.  Breathing labored.  Wheezing noted.  Sats of 95%.

## 2016-02-17 NOTE — ED Notes (Addendum)
Pt states she was [facility] at [facility], has recently finished course of Prednisone.

## 2016-02-20 ENCOUNTER — Ambulatory Visit: Payer: No Typology Code available for payment source

## 2016-02-20 ENCOUNTER — Telehealth: Payer: Self-pay

## 2016-02-20 NOTE — Telephone Encounter (Signed)
Message received from Marianna FussJeannie Crutchfield, RN CM requesting CM follow up with the patient.  Call placed to the patient and informed her of the [facility] services including pharmacy and financial counseling. She stated that she does not have insurance at this time. She also has [facility] but she is going to cancel the appointment because she does not have the money for the co-pay. An appointment was scheduled for 02/22/16 @ 1430. The patient stated that she has the address for the clinic. She was very appreciative of the call. She said that she is not able to return to work and will discuss with the provider the need for a note to excuse her from work.  Update provided to Carrolyn MeiersJ. Crutchfield, RN CM

## 2016-02-22 ENCOUNTER — Inpatient Hospital Stay: Payer: No Typology Code available for payment source

## 2016-02-26 ENCOUNTER — Ambulatory Visit: Payer: Self-pay | Attending: Physician Assistant | Admitting: Physician Assistant

## 2016-02-26 VITALS — BP 109/68 | HR 98 | Temp 98.1°F | Resp 18 | Ht 66.0 in | Wt 203.0 lb

## 2016-02-26 DIAGNOSIS — J4540 Moderate persistent asthma, uncomplicated: Secondary | ICD-10-CM | POA: Insufficient documentation

## 2016-02-26 MED ORDER — MOMETASONE FURO-FORMOTEROL FUM 100-5 MCG/ACT IN AERO: 2.0000 | INHALATION_SPRAY | Freq: Every day | RESPIRATORY_TRACT | 3 refills | Status: DC

## 2016-02-26 NOTE — Progress Notes (Signed)
Raven MuseMaya Holland  EAV:[id number]  NWG:[id number]  DOB - 1995-07-20  Chief Complaint  Patient presents with  . Asthma       Subjective:   Raven Holland is a 21 y.o. female here today for establishment of care. 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 She has been to the emergency department at least 10 times in the last 6 months with asthma exacerbations. She lost her insurance around October 2017. She does not have a primary care provider. Her most recent visit was on 02/17/2016. This was for cough with sputum production, shortness of breath and wheezing. Her x-ray was negative for an acute process. She was treated with nebs, prednisone, and inhalers. She had a good response and was discharged home with a prednisone taper and a Dulera inhaler. Her symptoms have improved. Her cough has subsided. Her breathing is fine. She's ready to go back to work. She is compliant with her regimen. She is a nonsmoker.  ROS: GEN: denies fever or chills, denies change in weight Skin: denies lesions or rashes HEENT: denies headache, earache, epistaxis, sore throat, or neck pain LUNGS: denies SHOB, dyspnea, PND, orthopnea CV: denies CP or palpitations ABD: denies abd pain, N or V EXT: denies muscle spasms or swelling; no pain in lower ext, no weakness NEURO: denies numbness or tingling, denies sz, stroke or TIA  No problems updated.  ALLERGIES: No Known Allergies  PAST MEDICAL HISTORY: Past Medical History:  Diagnosis Date  . Asthma     PAST SURGICAL HISTORY: History reviewed. No pertinent surgical history.  MEDICATIONS AT HOME: Prior to Admission medications   Medication Sig Start Date End Date Taking? Authorizing Provider  albuterol (PROVENTIL HFA;VENTOLIN HFA) 108 (90 Base) MCG/ACT inhaler Inhale 1-2 puffs into the lungs every 6 (six) hours as needed for wheezing or shortness of breath. 12/31/15  Yes Servando Salinaatherine H Rossi, NP  albuterol (PROVENTIL) (2.5 MG/3ML) 0.083% nebulizer  solution Take 3 mLs (2.5 mg total) by nebulization every 6 (six) hours as needed for wheezing or shortness of breath. 12/31/15  Yes Servando Salinaatherine H Rossi, NP  beclomethasone (QVAR) 40 MCG/ACT inhaler Inhale 2 puffs into the lungs 2 (two) times daily. 12/31/15  Yes Servando Salinaatherine H Rossi, NP  ipratropium (ATROVENT) 0.02 % nebulizer solution Take 2.5 mLs (0.5 mg total) by nebulization every 6 (six) hours as needed for wheezing or shortness of breath. 02/07/16  Yes Dorena BodoLawrence Kennard, NP  mometasone-formoterol (DULERA) 100-5 MCG/ACT AERO Inhale 2 puffs into the lungs daily. 02/26/16   Vivianne Masteriffany S Dalessandro Baldyga, PA-C     Objective:   Vitals:   02/26/16 1434  BP: 109/68  Pulse: 98  Resp: 18  Temp: 98.1 F (36.7 C)  TempSrc: Oral  SpO2: 100%  Weight: 203 lb (92.1 kg)  Height: 5\' 6"  (1.676 m)    Exam General appearance : Awake, alert, not in any distress. Speech Clear. Not toxic looking HEENT: Atraumatic and Normocephalic, pupils equally reactive to light and accomodation Neck: supple, no JVD. No cervical lymphadenopathy.  Chest:Good air entry bilaterally, no added sounds  CVS: S1 S2 regular, no murmurs.  Abdomen: Bowel sounds present, Non tender and not distended with no gaurding, rigidity or rebound. Extremities: B/L Lower Ext shows no edema, both legs are warm to touch Neurology: Awake alert, and oriented X 3, CN II-XII intact, Non focal Skin:No Rash Wounds:N/A  Data Review No results found for: HGBA1C   Assessment & Plan  1. Moderate persistent asthma  -Dulera refill  -Albuterol as  needed  -avoid triggers  RTC note/FMLA papers Return in about 4 weeks (around 03/25/2016).  The patient was given clear instructions to go to ER or return to medical center if symptoms don't improve, worsen or new problems develop. The patient verbalized understanding. The patient was told to call to get lab results if they haven't heard anything in the next week.   This note has been created with Engineer, agricultural. Any transcriptional errors are unintentional.    Scot Jun, PA-C [facility] [place], [place] [phone]   02/26/2016, 2:49 PM

## 2016-02-26 NOTE — Progress Notes (Signed)
Patient is here for ASTHMA HFU  Patient denies pain at this time.  [facility]. Patient has eaten today.  Patient declined the flu vaccine today.

## 2016-03-04 ENCOUNTER — Encounter: Payer: Self-pay | Admitting: Physician Assistant

## 2016-06-28 ENCOUNTER — Ambulatory Visit (HOSPITAL_COMMUNITY)
Admission: EM | Admit: 2016-06-28 | Discharge: 2016-06-28 | Disposition: A | Discharge status: Home / Self Care | Payer: Self-pay | Attending: Internal Medicine | Admitting: Internal Medicine

## 2016-06-28 ENCOUNTER — Encounter (HOSPITAL_COMMUNITY): Payer: Self-pay | Admitting: Emergency Medicine

## 2016-06-28 DIAGNOSIS — Z760 Encounter for issue of repeat prescription: Secondary | ICD-10-CM

## 2016-06-28 DIAGNOSIS — J45909 Unspecified asthma, uncomplicated: Secondary | ICD-10-CM

## 2016-06-28 MED ORDER — FLUTICASONE PROPIONATE HFA 110 MCG/ACT IN AERO: 2.0000 | INHALATION_SPRAY | Freq: Two times a day (BID) | RESPIRATORY_TRACT | 12 refills | Status: AC

## 2016-06-28 MED ORDER — ALBUTEROL SULFATE (2.5 MG/3ML) 0.083% IN NEBU: 2.5000 mg | INHALATION_SOLUTION | Freq: Four times a day (QID) | RESPIRATORY_TRACT | 12 refills | Status: AC | PRN

## 2016-06-28 MED ORDER — IPRATROPIUM BROMIDE 0.02 % IN SOLN: 0.5000 mg | Freq: Four times a day (QID) | RESPIRATORY_TRACT | 12 refills | Status: AC | PRN

## 2016-06-28 NOTE — ED Triage Notes (Signed)
Pt in need of a refill of her QVar. Pt states she has an older version of the medication and needs a prescription for the newer version.  [facility] was unable to see her today.

## 2016-06-28 NOTE — ED Provider Notes (Signed)
CSN: [id number]     Arrival date & time 06/28/16  1035 History   None    Chief Complaint  Patient presents with  . Medication Refill   (Consider location/radiation/quality/duration/timing/severity/associated sxs/prior Treatment) [name] is a 21 year old African-American female with a history of asthma that presents requesting a medication refill. Patient states that she is heading out of the country later today and would like her asthma medications refilled. She was previously on Qvar has a preventative and reports that this medication has been discontinued and would like something similar that she can afford as she does not have any insurance. Patient would also like her ipratropium and albuterol nebulizers as well. The patient voices no concerns at this time. Denies any shortness of breath, wheezing, cough or chest pain.      Past Medical History:  Diagnosis Date  . Asthma    History reviewed. No pertinent surgical history. Family History  Problem Relation Age of Onset  . Asthma Maternal Grandmother   . Asthma Paternal Grandmother    Social History  Substance Use Topics  . Smoking status: Never Smoker  . Smokeless tobacco: Never Used  . Alcohol use Yes     Comment: occasional   OB History    No data available     Review of Systems  All other systems reviewed and are negative.   Allergies  Patient has no known allergies.  Home Medications   Prior to Admission medications   Medication Sig Start Date End Date Taking? Authorizing Provider  albuterol (PROVENTIL) (2.5 MG/3ML) 0.083% nebulizer solution Take 3 mLs (2.5 mg total) by nebulization every 6 (six) hours as needed for wheezing or shortness of breath. 12/31/15  Yes Servando Salina, NP  ipratropium (ATROVENT) 0.02 % nebulizer solution Take 2.5 mLs (0.5 mg total) by nebulization every 6 (six) hours as needed for wheezing or shortness of breath. 02/07/16  Yes Dorena Bodo, NP  albuterol (PROVENTIL  HFA;VENTOLIN HFA) 108 (90 Base) MCG/ACT inhaler Inhale 1-2 puffs into the lungs every 6 (six) hours as needed for wheezing or shortness of breath. 12/31/15   Servando Salina, NP  albuterol (PROVENTIL) (2.5 MG/3ML) 0.083% nebulizer solution Take 3 mLs (2.5 mg total) by nebulization every 6 (six) hours as needed for wheezing or shortness of breath. 06/28/16   Lurline Idol, FNP  fluticasone (FLOVENT HFA) 110 MCG/ACT inhaler Inhale 2 puffs into the lungs 2 (two) times daily. 06/28/16 07/28/16  Lurline Idol, FNP  ipratropium (ATROVENT) 0.02 % nebulizer solution Take 2.5 mLs (0.5 mg total) by nebulization every 6 (six) hours as needed for wheezing or shortness of breath. 06/28/16   Lurline Idol, FNP   Meds Ordered and Administered this Visit  Medications - No data to display  BP 119/77 (BP Location: Right Arm)   Pulse 87   Temp 98.8 F (37.1 C) (Oral)   LMP 05/30/2016 (Exact Date)   SpO2 97%  No data found.   Physical Exam  Constitutional: She is oriented to person, place, and time. She appears well-developed and well-nourished. No distress.  Neck: Normal range of motion.  Cardiovascular: Normal rate and regular rhythm.   Pulmonary/Chest: Effort normal and breath sounds normal. No respiratory distress. She has no wheezes.  Musculoskeletal: Normal range of motion.  Neurological: She is alert and oriented to person, place, and time.  Skin: Skin is warm and dry.  Psychiatric: She has a normal mood and affect.    Urgent Care Course     Procedures (including  critical care time)  Labs Review Labs Reviewed - No data to display  Imaging Review No results found.   Visual Acuity Review  Right Eye Distance:   Left Eye Distance:   Bilateral Distance:    Right Eye Near:   Left Eye Near:    Bilateral Near:         MDM   1. Encounter for medication refill    Spoke with local Pharmacy to inquire about adequate substitution for her Qvar. He recommended Flovent 110 mcg  which would be equivalent to her previous medications. Also provided refills for her ipratropium and albuterol. Patient to follow-up with her PCP upon returning back [place].  All questions have been answered and all concerns have been addressed. The patient verbalized understanding and had no further questions     Lurline IdolMurrill, Bernd Crom, [place]NP 06/28/16 1143

## 2016-08-01 ENCOUNTER — Encounter (HOSPITAL_COMMUNITY): Payer: Self-pay | Admitting: Emergency Medicine

## 2016-08-01 ENCOUNTER — Ambulatory Visit (HOSPITAL_COMMUNITY)
Admission: EM | Admit: 2016-08-01 | Discharge: 2016-08-01 | Disposition: A | Discharge status: Home / Self Care | Payer: Self-pay | Attending: Family Medicine | Admitting: Family Medicine

## 2016-08-01 DIAGNOSIS — B349 Viral infection, unspecified: Secondary | ICD-10-CM

## 2016-08-01 DIAGNOSIS — J069 Acute upper respiratory infection, unspecified: Secondary | ICD-10-CM

## 2016-08-01 DIAGNOSIS — J4521 Mild intermittent asthma with (acute) exacerbation: Secondary | ICD-10-CM

## 2016-08-01 MED ORDER — PREDNISONE 10 MG (21) PO TBPK: ORAL_TABLET | ORAL | 0 refills | Status: AC

## 2016-08-01 NOTE — ED Triage Notes (Signed)
Pt reports nasal congestion and drainage that started on Monday.  Since then she has developed throat discomfort and a cough and some mild nausea.  Pt states the cold has caused her asthma to flare up.

## 2016-08-02 NOTE — ED Provider Notes (Signed)
  [facility]   [id number] 08/01/16 Arrival Time: 1440  ASSESSMENT & PLAN:  1. Mild intermittent asthma with acute exacerbation   2. Viral upper respiratory tract infection     Meds ordered this encounter  Medications  . predniSONE (STERAPRED UNI-PAK 21 TAB) 10 MG (21) TBPK tablet    Sig: Take as directed.    Dispense:  21 tablet    Refill:  0   OTC as needed. Reviewed expectations re: course of current medical issues. Questions answered. Outlined signs and symptoms indicating need for more acute intervention. Follow up here or in the Emergency Department if worsening. Patient verbalized understanding. After Visit Summary given.   SUBJECTIVE:  Raven Holland is a 21 y.o. female who presents with complaint of URI symptoms, cough, ST. Some wheezing. Mild nausea with cough but no emesis. Afebrile. Normal PO intake. No current SOB. Albuterol helping.  ROS: As per HPI.   OBJECTIVE:  Vitals:   08/01/16 1453  BP: 116/73  Pulse: 99  Temp: 98.5 F (36.9 C)  TempSrc: Oral  SpO2: 98%     General appearance: alert, cooperative, appears stated age and no distress Head: Normocephalic, without obvious abnormality, atraumatic Eyes: conjunctivae/corneas clear. PERRL, EOM's intact. Ears: normal TM's and external ear canals both ears Nose: congestion Throat: lips, mucosa, and tongue normal; teeth and gums normal Neck: no adenopathy and supple, symmetrical, trachea midline Back: symmetric, no curvature. ROM normal. No CVA tenderness. Lungs: wheezing with coughing; no distress; active cough Heart: regular rate and rhythm  No Known Allergies  PMHx, SurgHx, SocialHx, Medications, and Allergies were reviewed in the Visit Navigator and updated as appropriate.       Mardella LaymanHagler, Saga Balthazar, MD 08/02/16 [phone]

## 2016-08-09 NOTE — ED Notes (Signed)
Pt called stating she seen Dr. Tracie HarrierHagler and wanted him to refill her inhaler but couldn't remember the name of it since it's cheaper because she doesn't have insurance and said she now knows the name of the medication. It's qvar, called in to [facility] [place] and did let the pt know via vm and also told her it would be $146 out of pocket. I did check online for coupons and could not find anything cheaper. -JM

## 2017-03-08 IMAGING — DX DG CHEST 2V
2 series · 2 of 2 positions shown · non-contrast
Comparison: None.

CLINICAL DATA: Shortness of breath.  Wheezing.  History of asthma.

EXAM:
CHEST  2 VIEW

[chest pa]
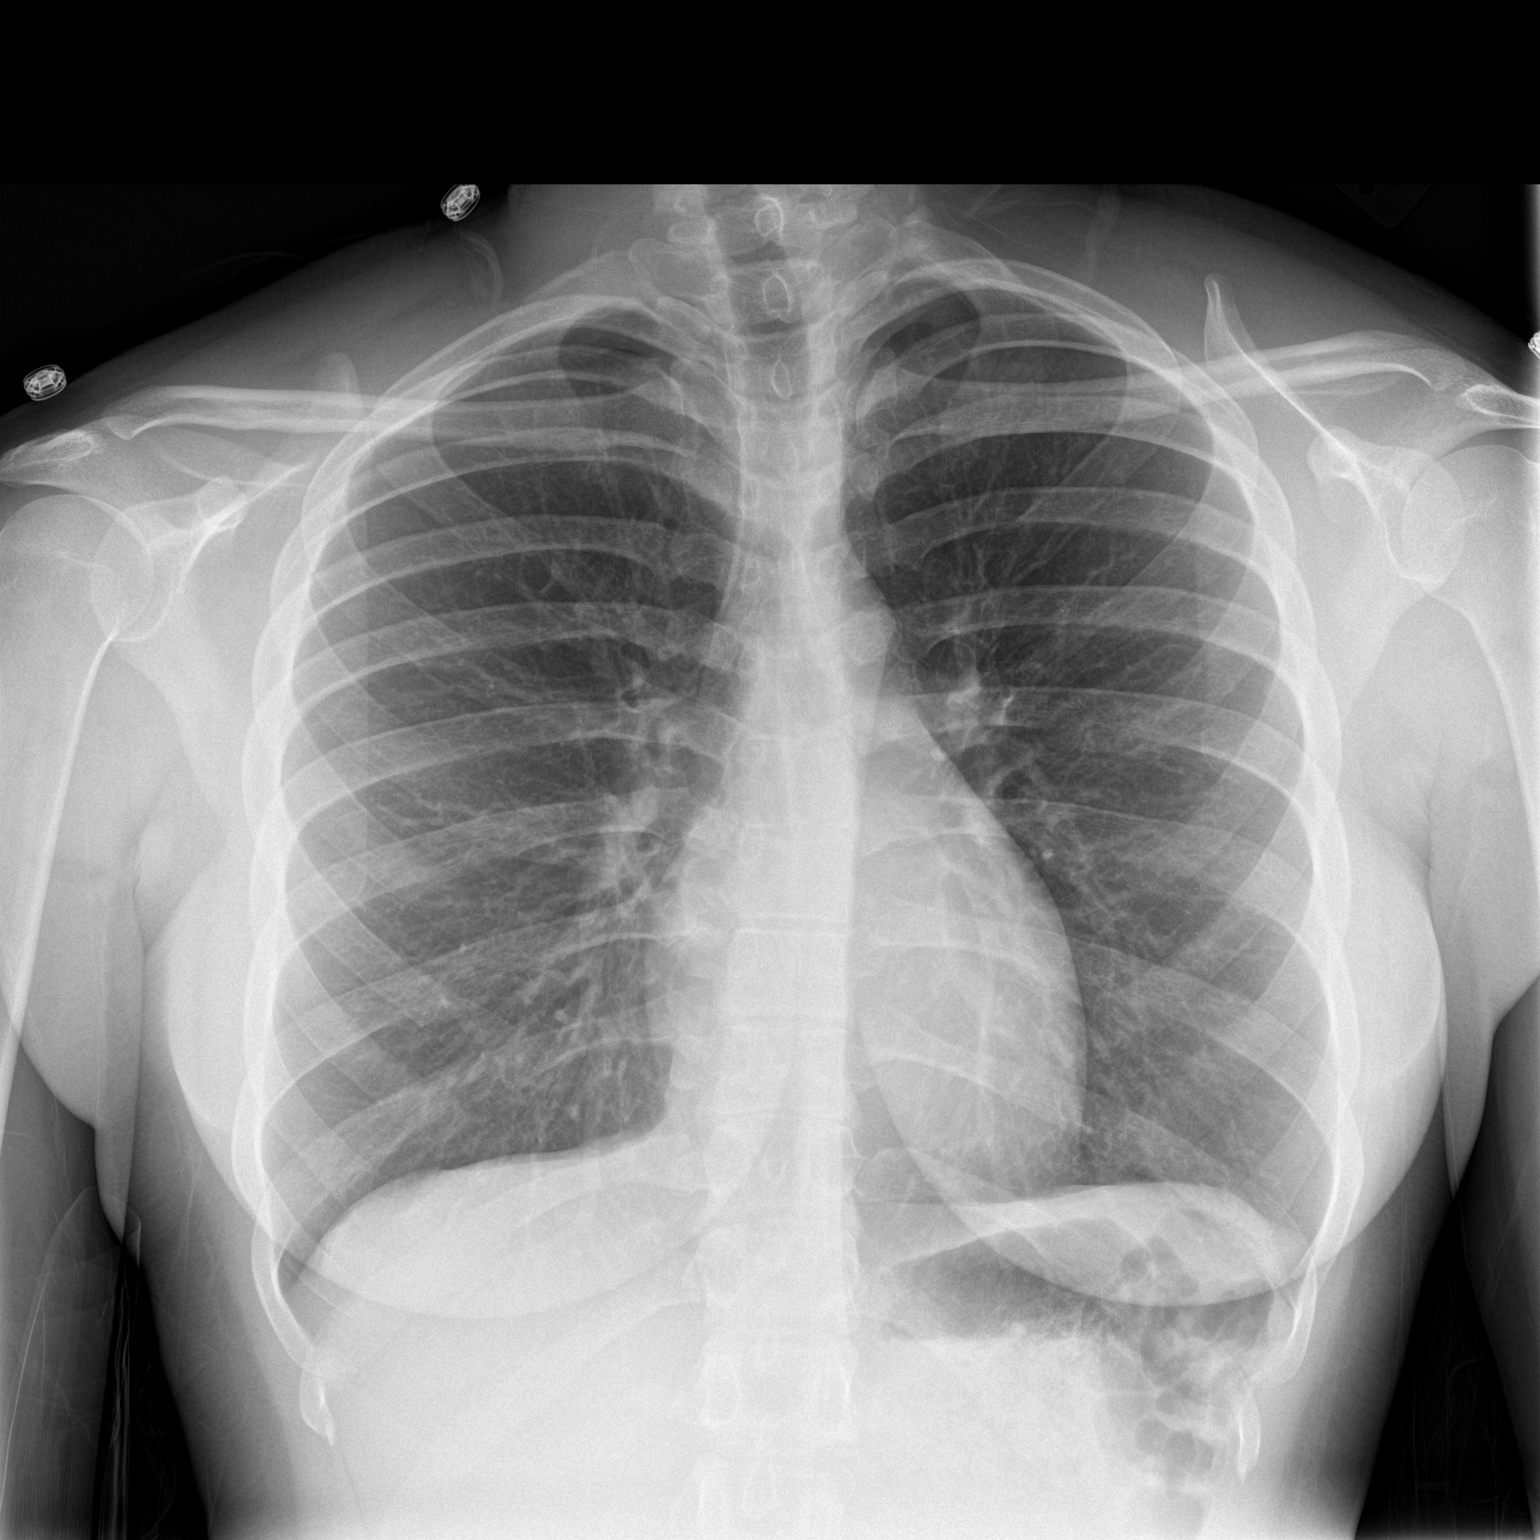

[chest lat]
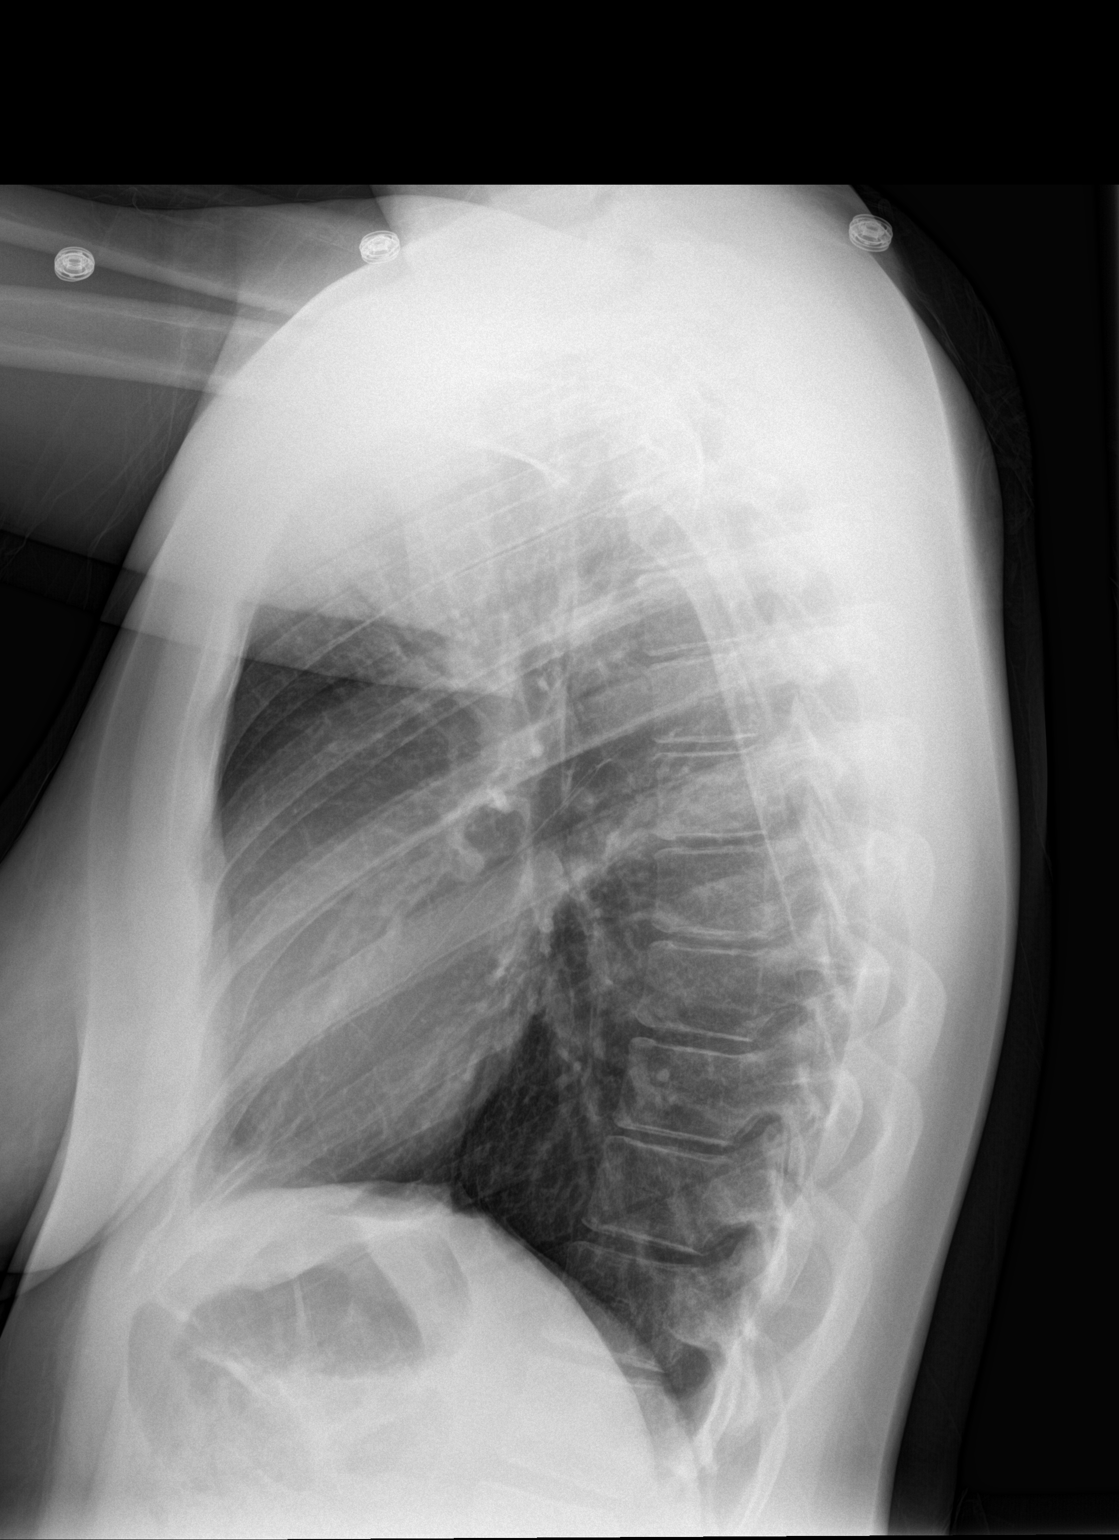

[2 of 2 positions shown; findings below may reference images not displayed]

FINDINGS: Normal heart size. Normal mediastinal contour. No pneumothorax. No
pleural effusion. Clear lungs, with no focal lung consolidation and
no pulmonary edema. Lungs do not appear significantly hyperinflated.
Visualized osseous structures appear intact.
IMPRESSION: No active disease in the chest.

## 2018-06-16 IMAGING — CR DG CHEST 2V
2 series · 2 of 2 positions shown · non-contrast
Comparison: Chest x-ray 10/17/2015.

CLINICAL DATA: 20-year-old female with history of asthma
exacerbation. Labored breathing and wheezing.

EXAM:
CHEST  2 VIEW

[chest pa]
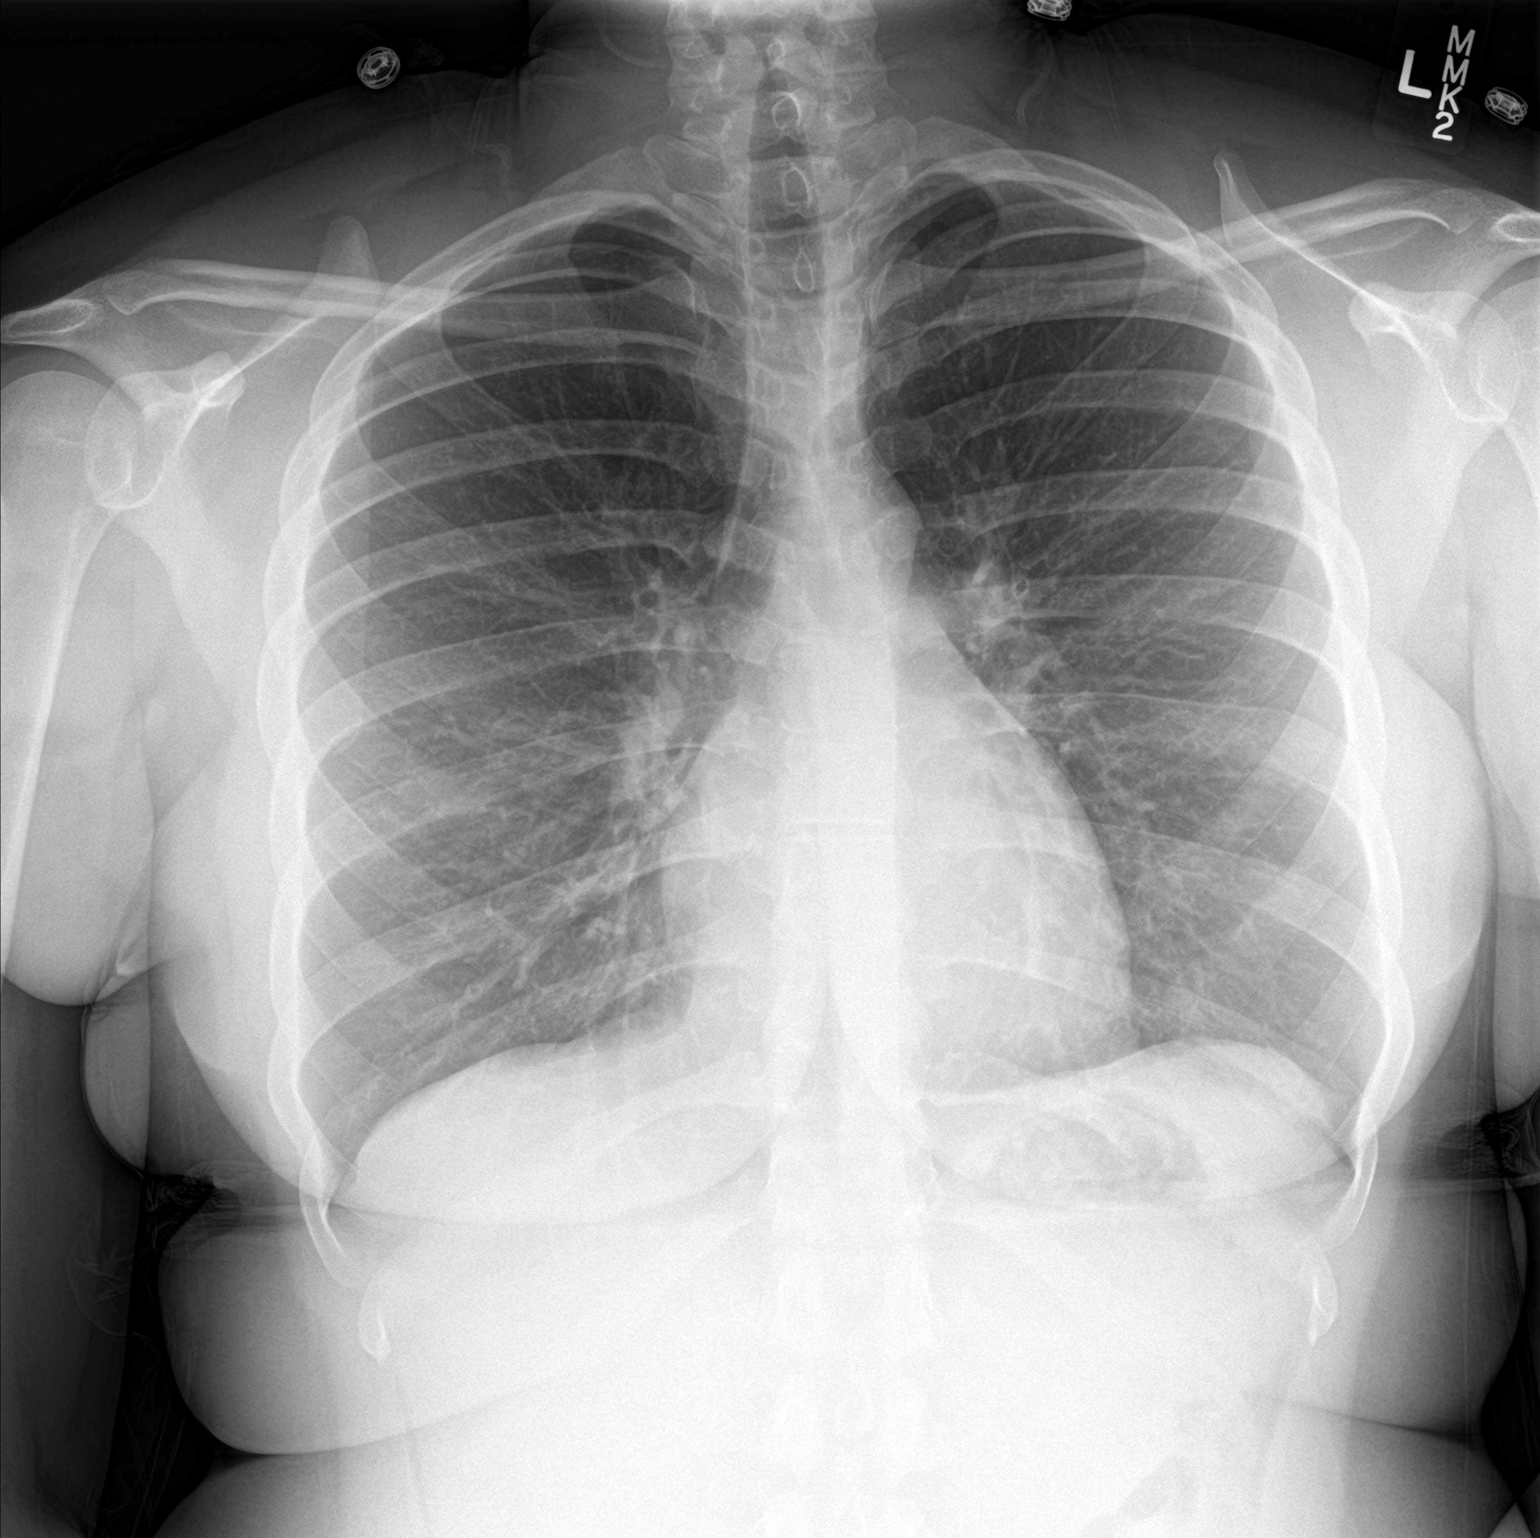

[chest lat]
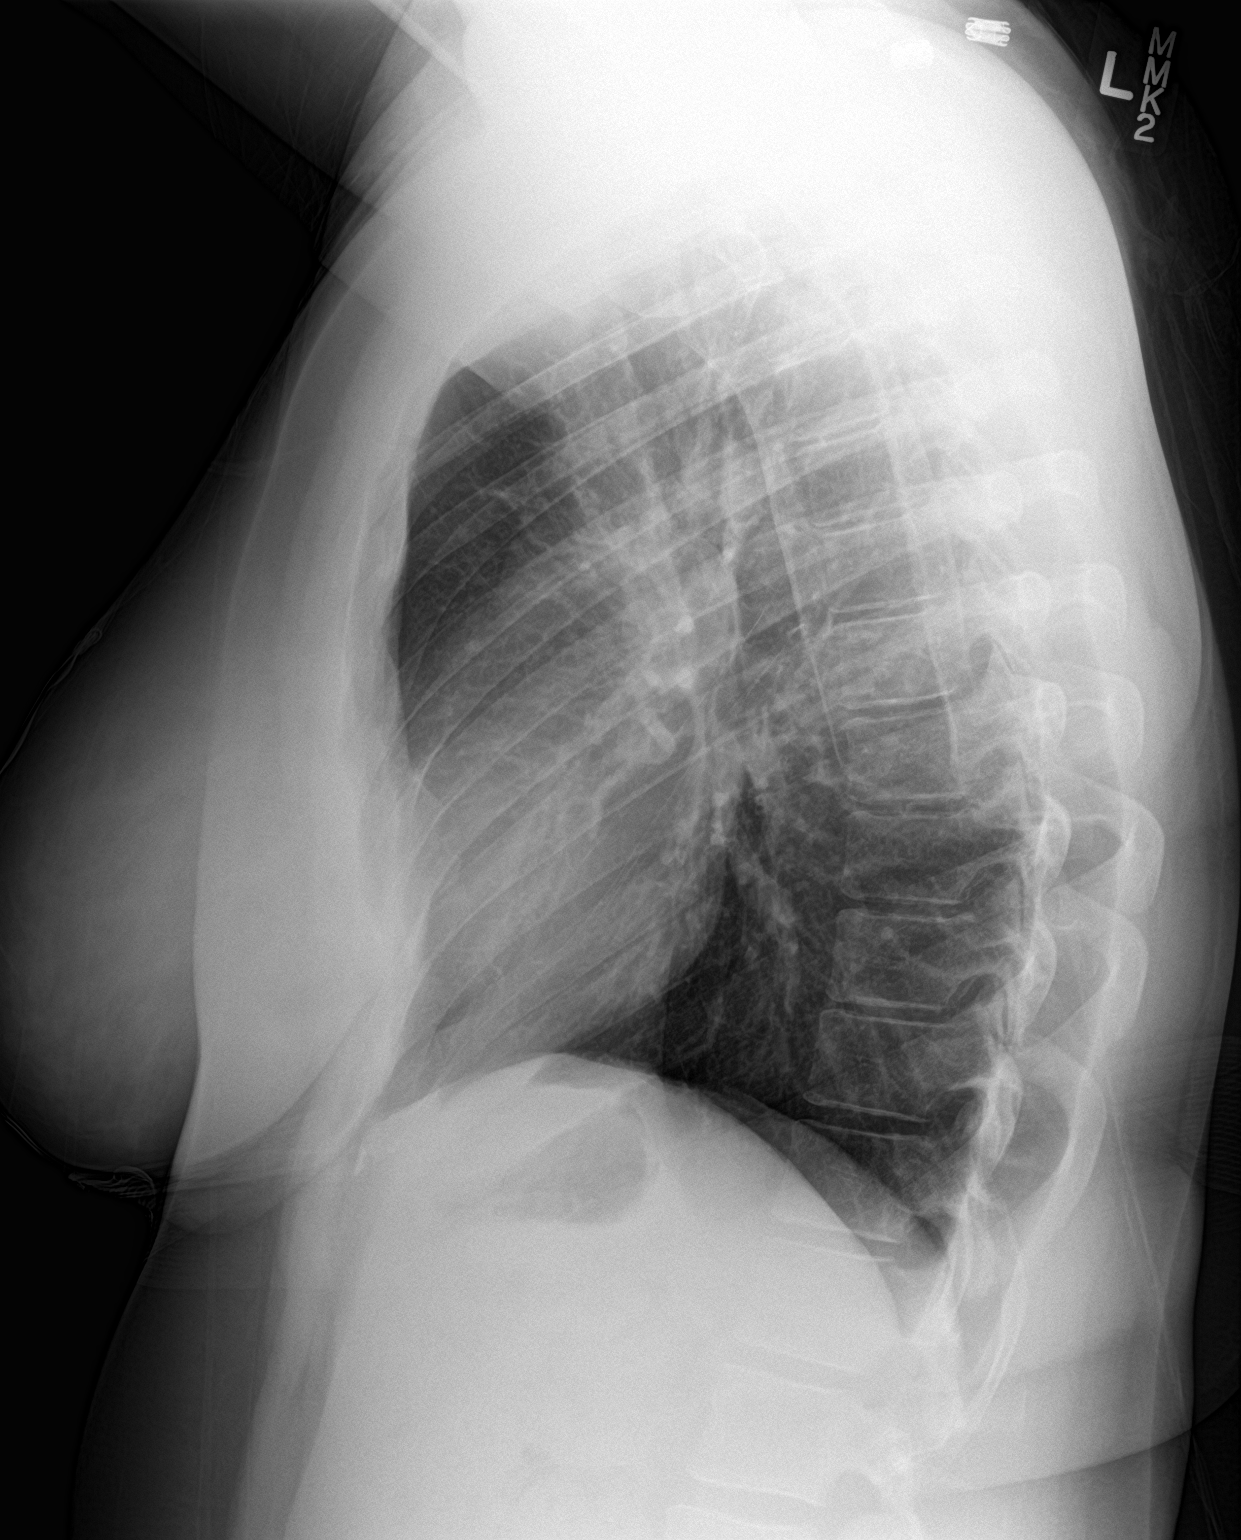

[2 of 2 positions shown; findings below may reference images not displayed]

FINDINGS: Lung volumes are normal. No consolidative airspace disease. No
pleural effusions. No pneumothorax. No pulmonary nodule or mass
noted. Pulmonary vasculature and the cardiomediastinal silhouette
are within normal limits.
IMPRESSION: No radiographic evidence of acute cardiopulmonary disease.
